# Patient Record
Sex: Male | Born: 1945 | Race: White | Hispanic: No | Smoking: Former smoker
Health system: Southern US, Community
[De-identification: ages and names within clinical notes are randomized; demographics above are authoritative.]

## PROBLEM LIST (undated history)

## (undated) DIAGNOSIS — I739 Peripheral vascular disease, unspecified: Secondary | ICD-10-CM

## (undated) DIAGNOSIS — Z86718 Personal history of other venous thrombosis and embolism: Secondary | ICD-10-CM

## (undated) DIAGNOSIS — Z8614 Personal history of Methicillin resistant Staphylococcus aureus infection: Secondary | ICD-10-CM

## (undated) HISTORY — PX: HIP SURGERY: SHX245

---

## 1998-04-20 ENCOUNTER — Inpatient Hospital Stay (HOSPITAL_COMMUNITY): Admission: EM | Admit: 1998-04-20 | Discharge: 1998-05-04 | Payer: Self-pay | Admitting: Emergency Medicine

## 1998-04-22 ENCOUNTER — Encounter: Payer: Self-pay | Admitting: Internal Medicine

## 1998-05-04 ENCOUNTER — Inpatient Hospital Stay
Admission: RE | Admit: 1998-05-04 | Discharge: 1998-06-25 | Payer: Self-pay | Admitting: Physical Medicine and Rehabilitation

## 1998-05-12 ENCOUNTER — Encounter: Payer: Self-pay | Admitting: Orthopedic Surgery

## 1998-06-15 ENCOUNTER — Encounter: Payer: Self-pay | Admitting: Physical Medicine and Rehabilitation

## 2005-09-20 ENCOUNTER — Encounter: Payer: Self-pay | Admitting: Emergency Medicine

## 2020-12-30 ENCOUNTER — Other Ambulatory Visit: Payer: Self-pay

## 2020-12-30 DIAGNOSIS — I739 Peripheral vascular disease, unspecified: Secondary | ICD-10-CM

## 2021-01-05 ENCOUNTER — Encounter (HOSPITAL_BASED_OUTPATIENT_CLINIC_OR_DEPARTMENT_OTHER): Payer: Medicare Other | Attending: Physician Assistant | Admitting: Physician Assistant

## 2021-01-07 ENCOUNTER — Ambulatory Visit (HOSPITAL_COMMUNITY)
Admission: RE | Admit: 2021-01-07 | Discharge: 2021-01-07 | Disposition: A | Discharge status: Home / Self Care | Payer: Medicare Other | Source: Ambulatory Visit | Attending: Vascular Surgery | Admitting: Vascular Surgery

## 2021-01-07 ENCOUNTER — Ambulatory Visit (INDEPENDENT_AMBULATORY_CARE_PROVIDER_SITE_OTHER): Payer: Medicare Other | Admitting: Vascular Surgery

## 2021-01-07 ENCOUNTER — Other Ambulatory Visit: Payer: Self-pay

## 2021-01-07 ENCOUNTER — Encounter: Payer: Self-pay | Admitting: Vascular Surgery

## 2021-01-07 VITALS — BP 180/84 | HR 67 | Temp 98.1°F | Resp 20

## 2021-01-07 DIAGNOSIS — I739 Peripheral vascular disease, unspecified: Secondary | ICD-10-CM | POA: Diagnosis not present

## 2021-01-07 NOTE — Progress Notes (Signed)
Patient ID: Francis Mckenzie, male   DOB: October 27, 1945, 75 y.o.   MRN: [id number]  Reason for Consult: New Patient (Initial Visit)   Referred by Eber Jones, NP  Subjective:     HPI:  Francis Mckenzie is a 75 y.o. male without previous history of vascular disease.  He is a lifelong smoker he currently vapes.  He states that several years ago he did have a wound on his medial left leg.  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  He states that he drinks 2-3 beers a day for his blood thinning.  He now has a couple month history of left lateral leg ulceration associated with swelling and skin changes.  There are 2 areas of ulceration.  He has not had any fevers or chills.  He did have Keflex treatment temporarily.  He is now getting local wound care with silver sulfadiazine and has been referred to wound care.  He has no previous vascular intervention.  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  He does not have stroke, TIA or amaurosis.  History reviewed. No pertinent past medical history.  History reviewed. No pertinent family history.  Past Surgical History:  Procedure Laterality Date   HIP SURGERY Right     Short Social History:  Social History   Tobacco Use   Smoking status: Former    Types: Cigarettes   Smokeless tobacco: Never  Substance Use Topics   Alcohol use: Yes    Not on File  Current Outpatient Medications  Medication Sig Dispense Refill   cephALEXin (KEFLEX) 500 MG capsule Take 500 mg by mouth every 12 (twelve) hours.     silver sulfADIAZINE (SILVADENE) 1 % cream Apply 1 application topically daily.     No current facility-administered medications for this visit.    Review of Systems  Constitutional:  Constitutional negative. HENT: HENT negative.  Eyes: Eyes negative.  Cardiovascular: Cardiovascular negative.  GI: Gastrointestinal negative.  Skin: Positive for wound.   Neurological: Neurological negative. Hematologic: Hematologic/lymphatic negative.  Psychiatric: Psychiatric negative.       Objective:  Objective   Vitals:   01/07/21 1543  BP: (!) 180/84  Pulse: 67  Resp: 20  Temp: 98.1 F (36.7 C)  SpO2: 98%   There is no height or weight on file to calculate BMI.  Physical Exam HENT:     Head: Normocephalic.     Nose:     Comments: Wearing a mask Eyes:     Pupils: Pupils are equal, round, and reactive to light.  Neck:     Vascular: No carotid bruit.  Cardiovascular:     Pulses:          Femoral pulses are 0 on the right side and 0 on the left side. Pulmonary:     Effort: Pulmonary effort is normal.  Abdominal:     General: Abdomen is flat.     Palpations: Abdomen is soft. There is no mass.  Neurological:     General: No focal deficit present.     Mental Status: He is alert.  Psychiatric:        Mood and Affect: Mood normal.        Behavior: Behavior normal.        Thought Content: Thought content normal.        Judgment: Judgment normal.     Data: ABI Findings:  +---------+------------------+-----+----------+--------+  Right    Rt Pressure (mmHg)IndexWaveform  Comment   +---------+------------------+-----+----------+--------+  Brachial 197                                        +---------+------------------+-----+----------+--------+  PTA      90                0.45 monophasic          +---------+------------------+-----+----------+--------+  DP       82                0.41 monophasic          +---------+------------------+-----+----------+--------+  Great Toe83                0.42                     +---------+------------------+-----+----------+--------+   +---------+------------------+-----+----------+-------+  Left     Lt Pressure (mmHg)IndexWaveform  Comment  +---------+------------------+-----+----------+-------+  Brachial 198                                        +---------+------------------+-----+----------+-------+  PTA      112               0.57 monophasic         +---------+------------------+-----+----------+-------+  DP       124               0.63 monophasic         +---------+------------------+-----+----------+-------+  Great Toe106               0.54                    +---------+------------------+-----+----------+-------+   +-------+-----------+-----------+------------+------------+  ABI/TBIToday's ABIToday's TBIPrevious ABIPrevious TBI  +-------+-----------+-----------+------------+------------+  Right  0.45       0.42                                 +-------+-----------+-----------+------------+------------+  Left   0.63       0.54                                 +-------+-----------+-----------+------------+------------+      Assessment/Plan:     75 year old male with left lower extremity wounds with moderately depressed ABIs that are monophasic.  I cannot reliably feel femoral pulses and I discussed with him either angiography from a left upper extremity approach versus CT angio.  We are going to pursue CT angio in ASAP fashion and get him back for further evaluation after this.  I discussed with him the nature of his ulceration which is likely mixed venous and arterial in nature but we will work-up the arterial phase prior to considering any venous treatment.  He demonstrates good understanding in the presence of his granddaughter today.     Maeola Harman MD [facility]

## 2021-01-11 ENCOUNTER — Other Ambulatory Visit: Payer: Self-pay

## 2021-01-11 DIAGNOSIS — I739 Peripheral vascular disease, unspecified: Secondary | ICD-10-CM

## 2021-01-17 ENCOUNTER — Other Ambulatory Visit: Payer: Self-pay

## 2021-01-17 ENCOUNTER — Ambulatory Visit (HOSPITAL_COMMUNITY)
Admission: RE | Admit: 2021-01-17 | Discharge: 2021-01-17 | Disposition: A | Discharge status: Home / Self Care | Payer: Medicare Other | Source: Ambulatory Visit | Attending: Vascular Surgery | Admitting: Vascular Surgery

## 2021-01-17 DIAGNOSIS — I739 Peripheral vascular disease, unspecified: Secondary | ICD-10-CM | POA: Diagnosis not present

## 2021-01-17 LAB — POCT I-STAT CREATININE: Creatinine, Ser: 0.9 mg/dL (ref 0.61–1.24)

## 2021-01-17 MED ORDER — IOHEXOL 350 MG/ML SOLN
100.0000 mL | Freq: Once | INTRAVENOUS | Status: AC | PRN
  Administered 2021-01-17: 100 mL via INTRAVENOUS

## 2021-01-19 ENCOUNTER — Other Ambulatory Visit: Payer: Self-pay

## 2021-01-19 ENCOUNTER — Ambulatory Visit (INDEPENDENT_AMBULATORY_CARE_PROVIDER_SITE_OTHER): Payer: Medicare Other | Admitting: Vascular Surgery

## 2021-01-19 ENCOUNTER — Encounter: Payer: Self-pay | Admitting: Vascular Surgery

## 2021-01-19 VITALS — BP 181/99 | HR 70 | Temp 97.9°F | Resp 20 | Ht 72.0 in | Wt 196.0 lb

## 2021-01-19 DIAGNOSIS — I739 Peripheral vascular disease, unspecified: Secondary | ICD-10-CM | POA: Diagnosis not present

## 2021-01-19 NOTE — Progress Notes (Signed)
Patient ID: Francis Mckenzie, male   DOB: 1945-05-29, 75 y.o.   MRN: [id number]  Reason for Consult: Follow-up   Referred by Eber Jones, NP  Subjective:     HPI:  Tyliek Timberman is a 75 y.o. male follows up for evaluation of left lower extremity wounds.  He has an IVC filter in place now drinks 2-3 beers daily for blood thinning medication is not on any blood thinners.  He does not take any antiplatelet agents.  He has not had any progression of his wounds on his left leg continues wound care with silver sulfadiazine.  He returns today for evaluation with CT scan  History reviewed. No pertinent past medical history. History reviewed. No pertinent family history. Past Surgical History:  Procedure Laterality Date   HIP SURGERY Right     Short Social History:  Social History   Tobacco Use   Smoking status: Former    Types: Cigarettes   Smokeless tobacco: Never  Substance Use Topics   Alcohol use: Yes    Not on File  Current Outpatient Medications  Medication Sig Dispense Refill   cephALEXin (KEFLEX) 500 MG capsule Take 500 mg by mouth every 12 (twelve) hours.     silver sulfADIAZINE (SILVADENE) 1 % cream Apply 1 application topically daily.     No current facility-administered medications for this visit.    Review of Systems  Constitutional:  Constitutional negative. HENT: HENT negative.  Eyes: Eyes negative.  Respiratory: Respiratory negative.  Cardiovascular: Cardiovascular negative.  GI: Gastrointestinal negative.  Musculoskeletal: Positive for leg pain.  Skin: Positive for wound.  Neurological: Neurological negative. Hematologic: Hematologic/lymphatic negative.  Psychiatric: Psychiatric negative.       Objective:  Objective   Vitals:   01/19/21 0952  BP: (!) 181/99  Pulse: 70  Resp: 20  Temp: 97.9 F (36.6 C)  SpO2: 97%  Weight: 196 lb (88.9 kg)  Height: 6' (1.829 m)   Body mass index is 26.58 kg/m.  Physical Exam HENT:     Head:  Normocephalic.     Nose:     Comments: Wearing a mask Eyes:     Pupils: Pupils are equal, round, and reactive to light.  Cardiovascular:     Pulses:          Femoral pulses are 0 on the right side and 0 on the left side. Pulmonary:     Effort: Pulmonary effort is normal.  Abdominal:     General: Abdomen is flat.     Palpations: Abdomen is soft.  Musculoskeletal:     Left lower leg: Edema present.  Skin:    Capillary Refill: Capillary refill takes less than 2 seconds.     Comments: Stable wound left lateral leg  Neurological:     General: No focal deficit present.     Mental Status: He is alert.    Data: CTA IMPRESSION: 1. 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 2. The right posterior tibial artery is very diminutive, and there is distal substitution of the posterior tibial artery branches by the peroneal artery. There are scattered foci of calcific atherosclerosis without significant stenosis and two vessel runoff of the anterior tibial and peroneal arteries to the ankle. 3. 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 4. Patent three vessel runoff to the left ankle  with standard branching anatomy. 5. Saphenous system varicosities about the calves. 6. Infrarenal IVC filter.     ABI Findings:  +---------+------------------+-----+----------+--------+  Right    Rt Pressure (mmHg)IndexWaveform  Comment   +---------+------------------+-----+----------+--------+  Brachial 197                                        +---------+------------------+-----+----------+--------+  PTA      90                0.45 monophasic          +---------+------------------+-----+----------+--------+  DP       82                0.41 monophasic           +---------+------------------+-----+----------+--------+  Great Toe83                0.42                     +---------+------------------+-----+----------+--------+   +---------+------------------+-----+----------+-------+  Left     Lt Pressure (mmHg)IndexWaveform  Comment  +---------+------------------+-----+----------+-------+  Brachial 198                                       +---------+------------------+-----+----------+-------+  PTA      112               0.57 monophasic         +---------+------------------+-----+----------+-------+  DP       124               0.63 monophasic         +---------+------------------+-----+----------+-------+  Great Toe106               0.54                    +---------+------------------+-----+----------+-------+   Assessment/Plan:     75 year old male with bilateral lower extremity decreased ABIs actually worse on the right but with wounds on the left leg.  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  Putting this altogether I have offered the patient left common femoral endarterectomy with retrograde left common iliac artery stenting and will likely need antegrade left popliteal artery stenting.  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  He will give baby aspirin.  We will get cardiac clearance and get him scheduled in the hybrid room in the near future.     Maeola Harman MD [facility]

## 2021-01-19 NOTE — H&P (View-Only) (Signed)
Patient ID: Francis Mckenzie, male   DOB: 1945-05-29, 75 y.o.   MRN: [id number]  Reason for Consult: Follow-up   Referred by Eber Jones, NP  Subjective:     HPI:  Francis Mckenzie is a 75 y.o. male follows up for evaluation of left lower extremity wounds.  He has an IVC filter in place now drinks 2-3 beers daily for blood thinning medication is not on any blood thinners.  He does not take any antiplatelet agents.  He has not had any progression of his wounds on his left leg continues wound care with silver sulfadiazine.  He returns today for evaluation with CT scan  History reviewed. No pertinent past medical history. History reviewed. No pertinent family history. Past Surgical History:  Procedure Laterality Date   HIP SURGERY Right     Short Social History:  Social History   Tobacco Use   Smoking status: Former    Types: Cigarettes   Smokeless tobacco: Never  Substance Use Topics   Alcohol use: Yes    Not on File  Current Outpatient Medications  Medication Sig Dispense Refill   cephALEXin (KEFLEX) 500 MG capsule Take 500 mg by mouth every 12 (twelve) hours.     silver sulfADIAZINE (SILVADENE) 1 % cream Apply 1 application topically daily.     No current facility-administered medications for this visit.    Review of Systems  Constitutional:  Constitutional negative. HENT: HENT negative.  Eyes: Eyes negative.  Respiratory: Respiratory negative.  Cardiovascular: Cardiovascular negative.  GI: Gastrointestinal negative.  Musculoskeletal: Positive for leg pain.  Skin: Positive for wound.  Neurological: Neurological negative. Hematologic: Hematologic/lymphatic negative.  Psychiatric: Psychiatric negative.       Objective:  Objective   Vitals:   01/19/21 0952  BP: (!) 181/99  Pulse: 70  Resp: 20  Temp: 97.9 F (36.6 C)  SpO2: 97%  Weight: 196 lb (88.9 kg)  Height: 6' (1.829 m)   Body mass index is 26.58 kg/m.  Physical Exam HENT:     Head:  Normocephalic.     Nose:     Comments: Wearing a mask Eyes:     Pupils: Pupils are equal, round, and reactive to light.  Cardiovascular:     Pulses:          Femoral pulses are 0 on the right side and 0 on the left side. Pulmonary:     Effort: Pulmonary effort is normal.  Abdominal:     General: Abdomen is flat.     Palpations: Abdomen is soft.  Musculoskeletal:     Left lower leg: Edema present.  Skin:    Capillary Refill: Capillary refill takes less than 2 seconds.     Comments: Stable wound left lateral leg  Neurological:     General: No focal deficit present.     Mental Status: He is alert.    Data: CTA IMPRESSION: 1. 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 2. The right posterior tibial artery is very diminutive, and there is distal substitution of the posterior tibial artery branches by the peroneal artery. There are scattered foci of calcific atherosclerosis without significant stenosis and two vessel runoff of the anterior tibial and peroneal arteries to the ankle. 3. 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 4. Patent three vessel runoff to the left ankle  with standard branching anatomy. 5. Saphenous system varicosities about the calves. 6. Infrarenal IVC filter.     ABI Findings:  +---------+------------------+-----+----------+--------+  Right    Rt Pressure (mmHg)IndexWaveform  Comment   +---------+------------------+-----+----------+--------+  Brachial 197                                        +---------+------------------+-----+----------+--------+  PTA      90                0.45 monophasic          +---------+------------------+-----+----------+--------+  DP       82                0.41 monophasic           +---------+------------------+-----+----------+--------+  Great Toe83                0.42                     +---------+------------------+-----+----------+--------+   +---------+------------------+-----+----------+-------+  Left     Lt Pressure (mmHg)IndexWaveform  Comment  +---------+------------------+-----+----------+-------+  Brachial 198                                       +---------+------------------+-----+----------+-------+  PTA      112               0.57 monophasic         +---------+------------------+-----+----------+-------+  DP       124               0.63 monophasic         +---------+------------------+-----+----------+-------+  Great Toe106               0.54                    +---------+------------------+-----+----------+-------+   Assessment/Plan:     75-year-old male with bilateral lower extremity decreased ABIs actually worse on the right but with wounds on the left leg.  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  Putting this altogether I have offered the patient left common femoral endarterectomy with retrograde left common iliac artery stenting and will likely need antegrade left popliteal artery stenting.  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  He will give baby aspirin.  We will get cardiac clearance and get him scheduled in the hybrid room in the near future.     Alvina Strother Christopher Amisadai Woodford MD [facility] [place]   

## 2021-01-21 ENCOUNTER — Encounter: Payer: Self-pay | Admitting: Interventional Cardiology

## 2021-01-21 ENCOUNTER — Encounter: Payer: Self-pay | Admitting: *Deleted

## 2021-01-21 ENCOUNTER — Other Ambulatory Visit: Payer: Self-pay

## 2021-01-21 ENCOUNTER — Ambulatory Visit (INDEPENDENT_AMBULATORY_CARE_PROVIDER_SITE_OTHER): Payer: Medicare Other | Admitting: Interventional Cardiology

## 2021-01-21 VITALS — BP 141/78 | HR 78 | Ht 72.0 in | Wt 196.6 lb

## 2021-01-21 DIAGNOSIS — E785 Hyperlipidemia, unspecified: Secondary | ICD-10-CM

## 2021-01-21 DIAGNOSIS — I700 Atherosclerosis of aorta: Secondary | ICD-10-CM

## 2021-01-21 DIAGNOSIS — I10 Essential (primary) hypertension: Secondary | ICD-10-CM | POA: Diagnosis not present

## 2021-01-21 DIAGNOSIS — I739 Peripheral vascular disease, unspecified: Secondary | ICD-10-CM

## 2021-01-21 DIAGNOSIS — Z01818 Encounter for other preprocedural examination: Secondary | ICD-10-CM | POA: Diagnosis not present

## 2021-01-21 DIAGNOSIS — Z01810 Encounter for preprocedural cardiovascular examination: Secondary | ICD-10-CM

## 2021-01-21 DIAGNOSIS — I2510 Atherosclerotic heart disease of native coronary artery without angina pectoris: Secondary | ICD-10-CM

## 2021-01-21 NOTE — Patient Instructions (Signed)
Medication Instructions:  Your physician recommends that you continue on your current medications as directed. Please refer to the Current Medication list given to you today.  *If you need a refill on your cardiac medications before your next appointment, please call your pharmacy*   Lab Work: None If you have labs (blood work) drawn today and your tests are completely normal, you will receive your results only by: MyChart Message (if you have MyChart) OR A paper copy in the mail If you have any lab test that is abnormal or we need to change your treatment, we will call you to review the results.   Testing/Procedures: Your physician has requested that you have a lexiscan myoview. For further information please visit [email or web address]. Please follow instruction sheet, as given.   Follow-Up: At [facility], you and your health needs are our priority.  As part of our continuing mission to provide you with exceptional heart care, we have created designated Provider Care Teams.  These Care Teams include your primary Cardiologist (physician) and Advanced Practice Providers (APPs -  Physician Assistants and Nurse Practitioners) who all work together to provide you with the care you need, when you need it.  We recommend signing up for the patient portal called "MyChart".  Sign up information is provided on this After Visit Summary.  MyChart is used to connect with patients for Virtual Visits (Telemedicine).  Patients are able to view lab/test results, encounter notes, upcoming appointments, etc.  Non-urgent messages can be sent to your provider as well.   To learn more about what you can do with MyChart, go to [email or web address].    Your next appointment:   As needed  The format for your next appointment:   In Person  Provider:   You may see Henry Smith, MD or one of the following Advanced Practice Providers on your designated Care Team:   Laura Ingold, NP   Other Instructions    

## 2021-01-21 NOTE — Progress Notes (Signed)
Cardiology Office Note:    Date:  01/21/2021   ID:  Francis Mckenzie, DOB 10-29-1945, MRN [id number]  PCP:  Eber Jones, NP  Cardiologist:  None   Referring MD: Eber Jones, NP   Chief Complaint  Patient presents with   Coronary Artery Disease    Preoperative clearance for peripheral arterial disease surgery     History of Present Illness:    Francis Mckenzie is a 75 y.o. male with a hx of bilateral lower extremity claudication with poorly healing ulcer to undergo surgical revascularization and percutaneous revascularization of lower extremities.  Referred for clearance by Dr. Lemar Mckenzie.  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  He has no mobility in that joint.  He was hospitalized for greater than 5 months and apparently developed DVT requiring vena caval filter placement.  He never had PE.  He has a family history of vascular disease.  His brother and sister have both had MIs.  The brother died.  The patient smokes cigarettes and or vapes for greater than 60 years.  Recently he has noted a nonhealing ulcer on his left calf.  Is been present for greater than 2 months.  Lower extremity Doppler studies were performed by his physician and #.  Significant reduction in ABI was noted.  Subsequent CT angiography demonstrated high-grade obstructive disease   History reviewed. No pertinent past medical history.  Past Surgical History:  Procedure Laterality Date   HIP SURGERY Right     Current Medications: Current Meds  Medication Sig   cephALEXin (KEFLEX) 500 MG capsule Take 500 mg by mouth every 12 (twelve) hours.   silver sulfADIAZINE (SILVADENE) 1 % cream Apply 1 application topically daily.     Allergies:   Aspirin   Social History   Socioeconomic History   Marital status: Widowed    Spouse name: Not on file   Number of children: Not on file    Years of education: Not on file   Highest education level: Not on file  Occupational History   Not on file  Tobacco Use   Smoking status: Former    Types: Cigarettes   Smokeless tobacco: Never  Vaping Use   Vaping Use: Every day  Substance and Sexual Activity   Alcohol use: Yes   Drug use: Not on file   Sexual activity: Not on file  Other Topics Concern   Not on file  Social History Narrative   Not on file   Social Determinants of Health   Financial Resource Strain: Not on file  Food Insecurity: Not on file  Transportation Needs: Not on file  Physical Activity: Not on file  Stress: Not on file  Social Connections: Not on file     Family History: The patient's family history includes Heart attack in his sister.  ROS:   Please see the history of present illness.    Active.  Does chores around the house.  Denies shortness of breath, orthopnea, PND.  No leg pain.  Lower extremity lateral left calf ulcer present and nonhealing for about 2 months.  Denies chills and fever.  All other systems reviewed and are negative.  EKGs/Labs/Other Studies Reviewed:    The following studies were reviewed today:  Lower extremity vascular ultrasound 01/07/2021: Summary:  Right: Resting right ankle-brachial index indicates severe right lower  extremity arterial disease. The right  toe-brachial index is abnormal.   Left: Resting left ankle-brachial index indicates moderate left lower  extremity arterial disease. The left toe-brachial index is abnormal.     CT angio of abdominal aorta and bifemoral runoff 01/17/2021: IMPRESSION: 1. 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 2. The right posterior tibial artery is very diminutive, and there is distal substitution of the posterior tibial artery branches by the peroneal  artery. There are scattered foci of calcific atherosclerosis without significant stenosis and two vessel runoff of the anterior tibial and peroneal arteries to the ankle. 3. 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 4. Patent three vessel runoff to the left ankle with standard branching anatomy. 5. Saphenous system varicosities about the calves. 6. Infrarenal IVC filter.   Aortic Atherosclerosis (ICD10-I70.0).     EKG:  EKG normal sinus rhythm, normal PR, normal tracing.  No evidence of prior MI.  Recent Labs: 01/17/2021: Creatinine, Ser 0.90  Recent Lipid Panel No results found for: CHOL, TRIG, HDL, CHOLHDL, VLDL, LDLCALC, LDLDIRECT  Physical Exam:    VS:  BP (!) 141/78   Pulse 78   Ht 6' (1.829 m)   Wt 196 lb 9.6 oz (89.2 kg)   SpO2 96%   BMI 26.66 kg/m     Wt Readings from Last 3 Encounters:  01/21/21 196 lb 9.6 oz (89.2 kg)  01/19/21 196 lb (88.9 kg)     GEN: Compatible with age.  Sitting somewhat askew in the chair due to immobility of his right hip.. No acute distress HEENT: Normal NECK: No JVD. LYMPHATICS: No lymphadenopathy CARDIAC: No murmur. RRR no gallop, or edema. VASCULAR: Decreased Pulses bilaterally.  Lateral left calf ulcer, 2 spots.. No bruits. RESPIRATORY:  Clear to auscultation without rales, wheezing or rhonchi  ABDOMEN: Soft, non-tender, non-distended, No pulsatile mass, MUSCULOSKELETAL: No deformity  SKIN: Warm and dry NEUROLOGIC:  Alert and oriented x 3 PSYCHIATRIC:  Normal affect   ASSESSMENT:    1. Preoperative clearance   2. Intermittent claudication (HCC)   3. Hyperlipidemia LDL goal <70   4. Primary hypertension    PLAN:    In order of problems listed above:  Preoperative clearance is pending successful low risk performance on myocardial perfusion imaging with pharmacologic stress. He is in need of vascular surgery to improve lower extremity flow and allow healing of the ulcer  on his left calf. Needs high intensity statin therapy.  I do not have a lipid panel available. Blood pressure is a bit higher than we would like.  A medication such as amlodipine may be helpful.  Pharmacologic myocardial perfusion study and clearance for surgery if the study is low risk.  We will expedite the study to allow surgery to be done as soon as possible.   Medication Adjustments/Labs and Tests Ordered: Current medicines are reviewed at length with the patient today.  Concerns regarding medicines are outlined above.  Orders Placed This Encounter  Procedures   EKG 12-Lead    No orders of the defined types were placed in this encounter.   There are no Patient Instructions on file for this visit.   Signed, Lesleigh Noe, MD  01/21/2021 4:13 PM    [facility]

## 2021-01-26 ENCOUNTER — Telehealth (HOSPITAL_COMMUNITY): Payer: Self-pay

## 2021-01-26 NOTE — Telephone Encounter (Signed)
Spoke with the patient's daughter. She stated that he would be here for his test. Asked to call back with any questions. S.Branna Cortina EMTP

## 2021-01-27 ENCOUNTER — Ambulatory Visit (HOSPITAL_COMMUNITY): Payer: Medicare Other | Attending: Cardiovascular Disease

## 2021-01-27 ENCOUNTER — Other Ambulatory Visit: Payer: Self-pay

## 2021-01-27 DIAGNOSIS — Z01810 Encounter for preprocedural cardiovascular examination: Secondary | ICD-10-CM | POA: Diagnosis not present

## 2021-01-27 DIAGNOSIS — I739 Peripheral vascular disease, unspecified: Secondary | ICD-10-CM | POA: Insufficient documentation

## 2021-01-27 DIAGNOSIS — I10 Essential (primary) hypertension: Secondary | ICD-10-CM | POA: Insufficient documentation

## 2021-01-27 DIAGNOSIS — E785 Hyperlipidemia, unspecified: Secondary | ICD-10-CM | POA: Insufficient documentation

## 2021-01-27 DIAGNOSIS — Z01818 Encounter for other preprocedural examination: Secondary | ICD-10-CM | POA: Insufficient documentation

## 2021-01-27 DIAGNOSIS — I700 Atherosclerosis of aorta: Secondary | ICD-10-CM | POA: Insufficient documentation

## 2021-01-27 DIAGNOSIS — I2510 Atherosclerotic heart disease of native coronary artery without angina pectoris: Secondary | ICD-10-CM | POA: Insufficient documentation

## 2021-01-27 LAB — MYOCARDIAL PERFUSION IMAGING
Base ST Depression (mm): 0 mm
LV dias vol: 86 mL (ref 62–150)
LV sys vol: 46 mL
Nuc Stress EF: 46 %
Peak HR: 84 {beats}/min
Rest HR: 61 {beats}/min
Rest Nuclear Isotope Dose: 10.1 mCi
SDS: 3
SRS: 0
SSS: 3
ST Depression (mm): 0 mm
Stress Nuclear Isotope Dose: 31.4 mCi
TID: 1.07

## 2021-01-27 MED ORDER — TECHNETIUM TC 99M TETROFOSMIN IV KIT
31.4000 | PACK | Freq: Once | INTRAVENOUS | Status: AC | PRN
  Administered 2021-01-27: 31.4 via INTRAVENOUS
  Filled 2021-01-27: qty 32

## 2021-01-27 MED ORDER — REGADENOSON 0.4 MG/5ML IV SOLN
0.4000 mg | Freq: Once | INTRAVENOUS | Status: AC
  Administered 2021-01-27: 0.4 mg via INTRAVENOUS

## 2021-01-27 MED ORDER — TECHNETIUM TC 99M TETROFOSMIN IV KIT
10.1000 | PACK | Freq: Once | INTRAVENOUS | Status: AC | PRN
  Administered 2021-01-27: 10.1 via INTRAVENOUS
  Filled 2021-01-27: qty 11

## 2021-01-31 NOTE — Pre-Procedure Instructions (Signed)
Surgical Instructions    Your procedure is scheduled on Friday, September 16th.  Report to [facility] Main Entrance "A" at 7:00 A.M., then check in with the Admitting office.  Call this number if you have problems the morning of surgery:  [phone]   If you have any questions prior to your surgery date call [phone]: Open Monday-Friday 8am-4pm    Remember:  Do not eat or drink after midnight the night before your surgery   Take these medicines as needed the morning of surgery with A SIP OF WATER  acetaminophen (TYLENOL)   As of today, STOP taking any Aspirin (unless otherwise instructed by your surgeon) Aleve, Naproxen, Ibuprofen, Motrin, Advil, Goody's, BC's, all herbal medications, fish oil, and all vitamins.                     Do NOT Smoke (Tobacco/Vaping) or drink Alcohol 24 hours prior to your procedure.  If you use a CPAP at night, you may bring all equipment for your overnight stay.   Contacts, glasses, piercing's, hearing aid's, dentures or partials may not be worn into surgery, please bring cases for these belongings.    For patients admitted to the hospital, discharge time will be determined by your treatment team.   Patients discharged the day of surgery will not be allowed to drive home, and someone needs to stay with them for 24 hours.  ONLY 1 SUPPORT PERSON MAY BE PRESENT WHILE YOU ARE IN SURGERY. IF YOU ARE TO BE ADMITTED ONCE YOU ARE IN YOUR ROOM YOU WILL BE ALLOWED TWO (2) VISITORS.  Minor children may have two parents present. Special consideration for safety and communication needs will be reviewed on a case by case basis.   Special instructions:   [place]- Preparing For Surgery  Before surgery, you can play an important role. Because skin is not sterile, your skin needs to be as free of germs as possible. You can reduce the number of germs on your skin by washing with CHG (chlorahexidine gluconate) Soap before surgery.  CHG is an antiseptic cleaner  which kills germs and bonds with the skin to continue killing germs even after washing.    Oral Hygiene is also important to reduce your risk of infection.  Remember - BRUSH YOUR TEETH THE MORNING OF SURGERY WITH YOUR REGULAR TOOTHPASTE  Please do not use if you have an allergy to CHG or antibacterial soaps. If your skin becomes reddened/irritated stop using the CHG.  Do not shave (including legs and underarms) for at least 48 hours prior to first CHG shower. It is OK to shave your face.  Please follow these instructions carefully.   Shower the NIGHT BEFORE SURGERY and the MORNING OF SURGERY  If you chose to wash your hair, wash your hair first as usual with your normal shampoo.  After you shampoo, rinse your hair and body thoroughly to remove the shampoo.  Use CHG Soap as you would any other liquid soap. You can apply CHG directly to the skin and wash gently with a scrungie or a clean washcloth.   Apply the CHG Soap to your body ONLY FROM THE NECK DOWN.  Do not use on open wounds or open sores. Avoid contact with your eyes, ears, mouth and genitals (private parts). Wash Face and genitals (private parts)  with your normal soap.   Wash thoroughly, paying special attention to the area where your surgery will be performed.  Thoroughly rinse your body with  warm water from the neck down.  DO NOT shower/wash with your normal soap after using and rinsing off the CHG Soap.  Pat yourself dry with a CLEAN TOWEL.  Wear CLEAN PAJAMAS to bed the night before surgery  Place CLEAN SHEETS on your bed the night before your surgery  DO NOT SLEEP WITH PETS.   Day of Surgery: Shower with CHG soap. Do not wear jewelry Do not wear lotions, powders, colognes, or deodorant. Do not shave 48 hours prior to surgery.  Men may shave face and neck. Do not bring valuables to the hospital. [facility] is not responsible for any belongings or valuables. Wear Clean/Comfortable clothing the morning of  surgery Remember to brush your teeth WITH YOUR REGULAR TOOTHPASTE.   Please read over the following fact sheets that you were given.

## 2021-02-01 ENCOUNTER — Other Ambulatory Visit: Payer: Self-pay

## 2021-02-01 ENCOUNTER — Encounter (HOSPITAL_COMMUNITY)
Admission: RE | Admit: 2021-02-01 | Discharge: 2021-02-01 | Disposition: A | Discharge status: Home / Self Care | Payer: Medicare Other | Source: Ambulatory Visit | Attending: Vascular Surgery | Admitting: Vascular Surgery

## 2021-02-01 ENCOUNTER — Encounter (HOSPITAL_COMMUNITY): Payer: Self-pay

## 2021-02-01 DIAGNOSIS — Z20822 Contact with and (suspected) exposure to covid-19: Secondary | ICD-10-CM | POA: Insufficient documentation

## 2021-02-01 DIAGNOSIS — Z01812 Encounter for preprocedural laboratory examination: Secondary | ICD-10-CM | POA: Insufficient documentation

## 2021-02-01 HISTORY — DX: Peripheral vascular disease, unspecified: I73.9

## 2021-02-01 LAB — COMPREHENSIVE METABOLIC PANEL
ALT: 16 U/L (ref 0–44)
AST: 16 U/L (ref 15–41)
Albumin: 3.8 g/dL (ref 3.5–5.0)
Alkaline Phosphatase: 89 U/L (ref 38–126)
Anion gap: 10 (ref 5–15)
BUN: 9 mg/dL (ref 8–23)
CO2: 24 mmol/L (ref 22–32)
Calcium: 8.9 mg/dL (ref 8.9–10.3)
Chloride: 100 mmol/L (ref 98–111)
Creatinine, Ser: 0.88 mg/dL (ref 0.61–1.24)
GFR, Estimated: >60 mL/min (ref >60)
Glucose, Bld: 94 mg/dL (ref 70–99)
Potassium: 3.8 mmol/L (ref 3.5–5.1)
Sodium: 134 mmol/L — ABNORMAL LOW (ref 135–145)
Total Bilirubin: 1.7 mg/dL — ABNORMAL HIGH (ref 0.3–1.2)
Total Protein: 7.1 g/dL (ref 6.5–8.1)

## 2021-02-01 LAB — URINALYSIS, ROUTINE W REFLEX MICROSCOPIC
Bilirubin Urine: NEGATIVE
Glucose, UA: NEGATIVE mg/dL
Hgb urine dipstick: NEGATIVE
Ketones, ur: NEGATIVE mg/dL
Leukocytes,Ua: NEGATIVE
Nitrite: NEGATIVE
Protein, ur: NEGATIVE mg/dL
Specific Gravity, Urine: 1.009 (ref 1.005–1.030)
pH: 6 (ref 5.0–8.0)

## 2021-02-01 LAB — TYPE AND SCREEN
ABO/RH(D): A POS
Antibody Screen: NEGATIVE

## 2021-02-01 LAB — CBC
HCT: 49.2 % (ref 39.0–52.0)
Hemoglobin: 16.1 g/dL (ref 13.0–17.0)
MCH: 30.4 pg (ref 26.0–34.0)
MCHC: 32.7 g/dL (ref 30.0–36.0)
MCV: 92.8 fL (ref 80.0–100.0)
Platelets: 229 10*3/uL (ref 150–400)
RBC: 5.3 MIL/uL (ref 4.22–5.81)
RDW: 12.8 % (ref 11.5–15.5)
WBC: 6.9 10*3/uL (ref 4.0–10.5)
nRBC: 0 % (ref 0.0–0.2)

## 2021-02-01 LAB — SURGICAL PCR SCREEN
MRSA, PCR: POSITIVE — AB
Staphylococcus aureus: POSITIVE — AB

## 2021-02-01 LAB — PROTIME-INR
INR: 0.9 (ref 0.8–1.2)
Prothrombin Time: 12.3 seconds (ref 11.4–15.2)

## 2021-02-01 LAB — SARS CORONAVIRUS 2 (TAT 6-24 HRS): SARS Coronavirus 2: NEGATIVE

## 2021-02-01 LAB — APTT: aPTT: 28 seconds (ref 24–36)

## 2021-02-01 NOTE — Progress Notes (Signed)
PCP - Terrilyn Saver, NP Cardiologist - Dr. Verdis Prime  Chest x-ray - n/a EKG - 01/21/21 Stress Test - 01/27/21 ECHO - denies Cardiac Cath - denies  Sleep Study - denies CPAP - denies  Blood Thinner Instructions: n/a Aspirin Instructions: n/a  COVID TEST- 02/01/21; done in PAT   Anesthesia review: Yes, cardiac clearance 01/27/21  Patient denies shortness of breath, fever, cough and chest pain at PAT appointment   All instructions explained to the patient, with a verbal understanding of the material. Patient agrees to go over the instructions while at home for a better understanding. Patient also instructed to self quarantine after being tested for COVID-19. The opportunity to ask questions was provided.

## 2021-02-01 NOTE — Progress Notes (Signed)
Detailed VM left with Kia Breedlove at [facility] regarding pt being positive for MRSA/MSSA. Left callback number if needed.  Viviano Simas, RN

## 2021-02-02 NOTE — Anesthesia Preprocedure Evaluation (Addendum)
Anesthesia Evaluation  Patient identified by MRN, date of birth, ID band Patient awake    Reviewed: Allergy & Precautions, NPO status , Patient's Chart, lab work & pertinent test results  Airway Mallampati: II  TM Distance: >3 FB Neck ROM: Full    Dental  (+) Dental Advisory Given, Edentulous Lower, Edentulous Upper   Pulmonary former smoker,    Pulmonary exam normal breath sounds clear to auscultation       Cardiovascular + Peripheral Vascular Disease (PAD)  Normal cardiovascular exam Rhythm:Regular Rate:Normal     Neuro/Psych negative neurological ROS  negative psych ROS   GI/Hepatic negative GI ROS, Neg liver ROS,   Endo/Other  negative endocrine ROS  Renal/GU negative Renal ROS     Musculoskeletal negative musculoskeletal ROS (+)   Abdominal   Peds  Hematology negative hematology ROS (+)   Anesthesia Other Findings Day of surgery medications reviewed with the patient.  Reproductive/Obstetrics                           Anesthesia Physical Anesthesia Plan  ASA: 3  Anesthesia Plan: General   Post-op Pain Management:    Induction: Intravenous  PONV Risk Score and Plan: 2 and Dexamethasone and Ondansetron  Airway Management Planned: Oral ETT  Additional Equipment: Arterial line  Intra-op Plan:   Post-operative Plan: Extubation in OR  Informed Consent: I have reviewed the patients History and Physical, chart, labs and discussed the procedure including the risks, benefits and alternatives for the proposed anesthesia with the patient or authorized representative who has indicated his/her understanding and acceptance.     Dental advisory given  Plan Discussed with: CRNA  Anesthesia Plan Comments: (PAT note by Antionette Poles, PA-C: Patient had preop evaluation with cardiologist Dr. Verdis Prime on 01/21/2021.  Nuclear stress test was ordered for risk stratification.  Test done  01/27/2021 was low risk and Dr. Katrinka Blazing cleared patient to proceed with surgery.  History of DVT during prolonged hospitalization following right hip replacement complicated by infection.  IVC in place.  Poorly controlled hypertension.  He is not on any antihypertensive agents. BP Readings from Last 3 Encounters: 02/01/21 (!) 185/89 01/21/21 (!) 141/78 01/19/21 (!) 181/99    Preop labs reviewed, unremarkable.  EKG 01/21/2021: NSR.  Rate 78.  Nuclear stress 01/27/2021: . The study is normal. The study is low risk. . No ST deviation was noted. . LV perfusion is normal. There is no evidence of ischemia. There is no evidence of infarction. . Left ventricular function is normal. Nuclear stress EF: 46 %. The left ventricular ejection fraction is mildly decreased (45-54%). End diastolic cavity size is normal.  1. Reduced counts in the inferior segments with normal wall motion consistent with diaphragm attenuation. No evidence of ischemia or infarction. 2. LV function at lower limits of normal for this modality, 46%. Visually appears normal. 3. This is a low risk study.  )       Anesthesia Quick Evaluation

## 2021-02-02 NOTE — Progress Notes (Signed)
Anesthesia Chart Review:  Patient had preop evaluation with cardiologist Dr. Verdis Prime on 01/21/2021.  Nuclear stress test was ordered for risk stratification.  Test done 01/27/2021 was low risk and Dr. Katrinka Blazing cleared patient to proceed with surgery.  History of DVT during prolonged hospitalization following right hip replacement complicated by infection.  IVC in place.  Poorly controlled hypertension.  He is not on any antihypertensive agents. BP Readings from Last 3 Encounters:  02/01/21 (!) 185/89  01/21/21 (!) 141/78  01/19/21 (!) 181/99     Preop labs reviewed, unremarkable.  EKG 01/21/2021: NSR.  Rate 78.  Nuclear stress 01/27/2021:   The study is normal. The study is low risk.   No ST deviation was noted.   LV perfusion is normal. There is no evidence of ischemia. There is no evidence of infarction.   Left ventricular function is normal. Nuclear stress EF: 46 %. The left ventricular ejection fraction is mildly decreased (45-54%). End diastolic cavity size is normal.   Reduced counts in the inferior segments with normal wall motion consistent with diaphragm attenuation.  No evidence of ischemia or infarction. LV function at lower limits of normal for this modality, 46%.  Visually appears normal. This is a low risk study.    Zannie Cove [facility] Short Stay Center/Anesthesiology Phone [phone] 02/02/2021 12:57 PM

## 2021-02-04 ENCOUNTER — Other Ambulatory Visit: Payer: Self-pay

## 2021-02-04 ENCOUNTER — Inpatient Hospital Stay (HOSPITAL_COMMUNITY): Payer: Medicare Other | Admitting: Physician Assistant

## 2021-02-04 ENCOUNTER — Inpatient Hospital Stay (HOSPITAL_COMMUNITY): Payer: Medicare Other

## 2021-02-04 ENCOUNTER — Encounter (HOSPITAL_COMMUNITY): Payer: Self-pay | Admitting: Vascular Surgery

## 2021-02-04 ENCOUNTER — Inpatient Hospital Stay (HOSPITAL_COMMUNITY)
Admission: RE | Admit: 2021-02-04 | Discharge: 2021-02-06 | DRG: 253 | Disposition: A | Discharge status: Home Health | Payer: Medicare Other | Attending: Vascular Surgery | Admitting: Vascular Surgery

## 2021-02-04 ENCOUNTER — Encounter (HOSPITAL_COMMUNITY)
Admission: RE | Disposition: A | Discharge status: Home / Self Care | Payer: Self-pay | Source: Ambulatory Visit | Attending: Vascular Surgery

## 2021-02-04 ENCOUNTER — Inpatient Hospital Stay (HOSPITAL_COMMUNITY): Payer: Medicare Other | Admitting: Anesthesiology

## 2021-02-04 DIAGNOSIS — T84194A Other mechanical complication of internal fixation device of right femur, initial encounter: Secondary | ICD-10-CM | POA: Diagnosis present

## 2021-02-04 DIAGNOSIS — Z8249 Family history of ischemic heart disease and other diseases of the circulatory system: Secondary | ICD-10-CM | POA: Diagnosis not present

## 2021-02-04 DIAGNOSIS — I70248 Atherosclerosis of native arteries of left leg with ulceration of other part of lower left leg: Secondary | ICD-10-CM | POA: Diagnosis present

## 2021-02-04 DIAGNOSIS — I739 Peripheral vascular disease, unspecified: Secondary | ICD-10-CM

## 2021-02-04 DIAGNOSIS — I70245 Atherosclerosis of native arteries of left leg with ulceration of other part of foot: Secondary | ICD-10-CM

## 2021-02-04 DIAGNOSIS — I472 Ventricular tachycardia: Secondary | ICD-10-CM | POA: Diagnosis not present

## 2021-02-04 DIAGNOSIS — Z87891 Personal history of nicotine dependence: Secondary | ICD-10-CM | POA: Diagnosis not present

## 2021-02-04 DIAGNOSIS — Z20822 Contact with and (suspected) exposure to covid-19: Secondary | ICD-10-CM | POA: Diagnosis present

## 2021-02-04 DIAGNOSIS — Z95828 Presence of other vascular implants and grafts: Secondary | ICD-10-CM

## 2021-02-04 DIAGNOSIS — Z419 Encounter for procedure for purposes other than remedying health state, unspecified: Secondary | ICD-10-CM

## 2021-02-04 DIAGNOSIS — I10 Essential (primary) hypertension: Secondary | ICD-10-CM | POA: Diagnosis present

## 2021-02-04 DIAGNOSIS — Y848 Other medical procedures as the cause of abnormal reaction of the patient, or of later complication, without mention of misadventure at the time of the procedure: Secondary | ICD-10-CM | POA: Diagnosis present

## 2021-02-04 DIAGNOSIS — Z86718 Personal history of other venous thrombosis and embolism: Secondary | ICD-10-CM | POA: Diagnosis not present

## 2021-02-04 DIAGNOSIS — Z9889 Other specified postprocedural states: Secondary | ICD-10-CM

## 2021-02-04 HISTORY — PX: INSERTION OF ILIAC STENT: SHX6256

## 2021-02-04 HISTORY — PX: ENDARTERECTOMY FEMORAL: SHX5804

## 2021-02-04 HISTORY — PX: LOWER EXTREMITY ANGIOGRAM: SHX5508

## 2021-02-04 HISTORY — DX: Personal history of other venous thrombosis and embolism: Z86.718

## 2021-02-04 HISTORY — DX: Personal history of Methicillin resistant Staphylococcus aureus infection: Z86.14

## 2021-02-04 HISTORY — PX: INTRAVASCULAR STENT INCLUDING PTA AND IMAGING: SHX6671

## 2021-02-04 LAB — CBC
HCT: 48 % (ref 39.0–52.0)
Hemoglobin: 15.8 g/dL (ref 13.0–17.0)
MCH: 30.7 pg (ref 26.0–34.0)
MCHC: 32.9 g/dL (ref 30.0–36.0)
MCV: 93.2 fL (ref 80.0–100.0)
Platelets: 187 10*3/uL (ref 150–400)
RBC: 5.15 MIL/uL (ref 4.22–5.81)
RDW: 12.9 % (ref 11.5–15.5)
WBC: 10.2 10*3/uL (ref 4.0–10.5)
nRBC: 0 % (ref 0.0–0.2)

## 2021-02-04 LAB — ABO/RH: ABO/RH(D): A POS

## 2021-02-04 LAB — CREATININE, SERUM
Creatinine, Ser: 0.94 mg/dL (ref 0.61–1.24)
GFR, Estimated: >60 mL/min (ref >60)

## 2021-02-04 SURGERY — ENDARTERECTOMY, FEMORAL
Anesthesia: General | Laterality: Left

## 2021-02-04 MED ORDER — MAGNESIUM SULFATE 2 GM/50ML IV SOLN: 2.0000 g | Freq: Every day | INTRAVENOUS | Status: DC | PRN

## 2021-02-04 MED ORDER — PANTOPRAZOLE SODIUM 40 MG PO TBEC
40.0000 mg | DELAYED_RELEASE_TABLET | Freq: Every day | ORAL | Status: DC
  Administered 2021-02-05 – 2021-02-06 (×2): 40 mg via ORAL
  Filled 2021-02-04 (×2): qty 1

## 2021-02-04 MED ORDER — PROTAMINE SULFATE 10 MG/ML IV SOLN
INTRAVENOUS | Status: AC
  Filled 2021-02-04: qty 5

## 2021-02-04 MED ORDER — SENNOSIDES-DOCUSATE SODIUM 8.6-50 MG PO TABS: 1.0000 | ORAL_TABLET | Freq: Every evening | ORAL | Status: DC | PRN

## 2021-02-04 MED ORDER — DEXAMETHASONE SODIUM PHOSPHATE 10 MG/ML IJ SOLN
INTRAMUSCULAR | Status: AC
  Filled 2021-02-04: qty 1

## 2021-02-04 MED ORDER — DEXMEDETOMIDINE (PRECEDEX) IN NS 20 MCG/5ML (4 MCG/ML) IV SYRINGE
PREFILLED_SYRINGE | INTRAVENOUS | Status: DC | PRN
  Administered 2021-02-04: 8 ug via INTRAVENOUS

## 2021-02-04 MED ORDER — PHENYLEPHRINE 40 MCG/ML (10ML) SYRINGE FOR IV PUSH (FOR BLOOD PRESSURE SUPPORT)
PREFILLED_SYRINGE | INTRAVENOUS | Status: DC | PRN
  Administered 2021-02-04: 120 ug via INTRAVENOUS

## 2021-02-04 MED ORDER — POVIDONE-IODINE 10 % EX SOLN
CUTANEOUS | Status: DC | PRN
  Administered 2021-02-04: 1 via TOPICAL

## 2021-02-04 MED ORDER — HYDROMORPHONE HCL 1 MG/ML IJ SOLN: 0.5000 mg | INTRAMUSCULAR | Status: DC | PRN

## 2021-02-04 MED ORDER — HEPARIN SODIUM (PORCINE) 1000 UNIT/ML IJ SOLN
INTRAMUSCULAR | Status: DC | PRN
  Administered 2021-02-04: 5000 [IU] via INTRAVENOUS
  Administered 2021-02-04: 8000 [IU] via INTRAVENOUS

## 2021-02-04 MED ORDER — CLOPIDOGREL BISULFATE 75 MG PO TABS
75.0000 mg | ORAL_TABLET | Freq: Every day | ORAL | Status: DC
  Administered 2021-02-05 – 2021-02-06 (×2): 75 mg via ORAL
  Filled 2021-02-04 (×2): qty 1

## 2021-02-04 MED ORDER — HYDRALAZINE HCL 20 MG/ML IJ SOLN
INTRAMUSCULAR | Status: AC
  Administered 2021-02-04: 5 mg via INTRAVENOUS
  Filled 2021-02-04: qty 1

## 2021-02-04 MED ORDER — ENOXAPARIN SODIUM 30 MG/0.3ML IJ SOSY
30.0000 mg | PREFILLED_SYRINGE | INTRAMUSCULAR | Status: DC
  Administered 2021-02-05: 30 mg via SUBCUTANEOUS
  Filled 2021-02-04 (×2): qty 0.3

## 2021-02-04 MED ORDER — CEFAZOLIN SODIUM-DEXTROSE 2-4 GM/100ML-% IV SOLN
2.0000 g | Freq: Three times a day (TID) | INTRAVENOUS | Status: AC
  Administered 2021-02-04 – 2021-02-05 (×2): 2 g via INTRAVENOUS
  Filled 2021-02-04 (×2): qty 100

## 2021-02-04 MED ORDER — LIDOCAINE 2% (20 MG/ML) 5 ML SYRINGE
INTRAMUSCULAR | Status: DC | PRN
  Administered 2021-02-04: 80 mg via INTRAVENOUS

## 2021-02-04 MED ORDER — 0.9 % SODIUM CHLORIDE (POUR BTL) OPTIME
TOPICAL | Status: DC | PRN
  Administered 2021-02-04: 1000 mL

## 2021-02-04 MED ORDER — HEPARIN SODIUM (PORCINE) 1000 UNIT/ML IJ SOLN
INTRAMUSCULAR | Status: AC
  Filled 2021-02-04: qty 2

## 2021-02-04 MED ORDER — BISACODYL 5 MG PO TBEC: 5.0000 mg | DELAYED_RELEASE_TABLET | Freq: Every day | ORAL | Status: DC | PRN

## 2021-02-04 MED ORDER — ATORVASTATIN CALCIUM 10 MG PO TABS
10.0000 mg | ORAL_TABLET | Freq: Every day | ORAL | Status: DC
  Filled 2021-02-04: qty 1

## 2021-02-04 MED ORDER — VANCOMYCIN HCL IN DEXTROSE 1-5 GM/200ML-% IV SOLN
INTRAVENOUS | Status: AC
  Administered 2021-02-04: 1000 mg via INTRAVENOUS
  Filled 2021-02-04: qty 200

## 2021-02-04 MED ORDER — PROTAMINE SULFATE 10 MG/ML IV SOLN
INTRAVENOUS | Status: DC | PRN
  Administered 2021-02-04: 5 mg via INTRAVENOUS
  Administered 2021-02-04 (×2): 10 mg via INTRAVENOUS

## 2021-02-04 MED ORDER — SODIUM CHLORIDE 0.9 % IV SOLN: 500.0000 mL | Freq: Once | INTRAVENOUS | Status: DC | PRN

## 2021-02-04 MED ORDER — LABETALOL HCL 5 MG/ML IV SOLN
INTRAVENOUS | Status: AC
  Administered 2021-02-04: 10 mg via INTRAVENOUS
  Filled 2021-02-04: qty 4

## 2021-02-04 MED ORDER — METOPROLOL TARTRATE 5 MG/5ML IV SOLN: 2.0000 mg | INTRAVENOUS | Status: DC | PRN

## 2021-02-04 MED ORDER — ONDANSETRON HCL 4 MG/2ML IJ SOLN
INTRAMUSCULAR | Status: AC
  Filled 2021-02-04: qty 2

## 2021-02-04 MED ORDER — CHLORHEXIDINE GLUCONATE CLOTH 2 % EX PADS: 6.0000 | MEDICATED_PAD | Freq: Once | CUTANEOUS | Status: DC

## 2021-02-04 MED ORDER — POVIDONE-IODINE 7.5 % EX SOLN
CUTANEOUS | Status: DC | PRN
  Administered 2021-02-04: 1 via TOPICAL

## 2021-02-04 MED ORDER — ROCURONIUM BROMIDE 10 MG/ML (PF) SYRINGE
PREFILLED_SYRINGE | INTRAVENOUS | Status: AC
  Filled 2021-02-04: qty 10

## 2021-02-04 MED ORDER — ROCURONIUM BROMIDE 10 MG/ML (PF) SYRINGE
PREFILLED_SYRINGE | INTRAVENOUS | Status: DC | PRN
  Administered 2021-02-04: 10 mg via INTRAVENOUS
  Administered 2021-02-04: 30 mg via INTRAVENOUS
  Administered 2021-02-04: 60 mg via INTRAVENOUS

## 2021-02-04 MED ORDER — PROPOFOL 10 MG/ML IV BOLUS
INTRAVENOUS | Status: AC
  Filled 2021-02-04: qty 20

## 2021-02-04 MED ORDER — HEPARIN 6000 UNIT IRRIGATION SOLUTION
Status: AC
  Filled 2021-02-04: qty 500

## 2021-02-04 MED ORDER — VANCOMYCIN HCL IN DEXTROSE 1-5 GM/200ML-% IV SOLN: 1000.0000 mg | Freq: Once | INTRAVENOUS | Status: AC

## 2021-02-04 MED ORDER — ACETAMINOPHEN 500 MG PO TABS
1000.0000 mg | ORAL_TABLET | Freq: Once | ORAL | Status: AC
  Administered 2021-02-04: 1000 mg via ORAL
  Filled 2021-02-04: qty 2

## 2021-02-04 MED ORDER — PROPOFOL 10 MG/ML IV BOLUS
INTRAVENOUS | Status: DC | PRN
  Administered 2021-02-04: 130 mg via INTRAVENOUS

## 2021-02-04 MED ORDER — POTASSIUM CHLORIDE CRYS ER 20 MEQ PO TBCR: 20.0000 meq | EXTENDED_RELEASE_TABLET | Freq: Every day | ORAL | Status: DC | PRN

## 2021-02-04 MED ORDER — PHENYLEPHRINE HCL-NACL 20-0.9 MG/250ML-% IV SOLN
INTRAVENOUS | Status: DC | PRN
  Administered 2021-02-04: 25 ug/min via INTRAVENOUS

## 2021-02-04 MED ORDER — ALUM & MAG HYDROXIDE-SIMETH 200-200-20 MG/5ML PO SUSP: 15.0000 mL | ORAL | Status: DC | PRN

## 2021-02-04 MED ORDER — DEXAMETHASONE SODIUM PHOSPHATE 10 MG/ML IJ SOLN
INTRAMUSCULAR | Status: DC | PRN
  Administered 2021-02-04: 8 mg via INTRAVENOUS

## 2021-02-04 MED ORDER — ONDANSETRON HCL 4 MG/2ML IJ SOLN: 4.0000 mg | Freq: Four times a day (QID) | INTRAMUSCULAR | Status: DC | PRN

## 2021-02-04 MED ORDER — LABETALOL HCL 5 MG/ML IV SOLN
10.0000 mg | INTRAVENOUS | Status: DC | PRN
  Administered 2021-02-04 – 2021-02-06 (×2): 10 mg via INTRAVENOUS
  Filled 2021-02-04 (×3): qty 4

## 2021-02-04 MED ORDER — SODIUM CHLORIDE 0.9 % IV SOLN: INTRAVENOUS | Status: DC

## 2021-02-04 MED ORDER — ONDANSETRON HCL 4 MG/2ML IJ SOLN: 4.0000 mg | Freq: Once | INTRAMUSCULAR | Status: DC | PRN

## 2021-02-04 MED ORDER — LACTATED RINGERS IV SOLN: INTRAVENOUS | Status: DC

## 2021-02-04 MED ORDER — FENTANYL CITRATE (PF) 100 MCG/2ML IJ SOLN
INTRAMUSCULAR | Status: DC | PRN
  Administered 2021-02-04 (×3): 25 ug via INTRAVENOUS
  Administered 2021-02-04: 75 ug via INTRAVENOUS

## 2021-02-04 MED ORDER — SUGAMMADEX SODIUM 200 MG/2ML IV SOLN
INTRAVENOUS | Status: DC | PRN
  Administered 2021-02-04: 200 mg via INTRAVENOUS

## 2021-02-04 MED ORDER — DOCUSATE SODIUM 100 MG PO CAPS
100.0000 mg | ORAL_CAPSULE | Freq: Every day | ORAL | Status: DC
  Administered 2021-02-05 – 2021-02-06 (×2): 100 mg via ORAL
  Filled 2021-02-04 (×2): qty 1

## 2021-02-04 MED ORDER — HEMOSTATIC AGENTS (NO CHARGE) OPTIME
TOPICAL | Status: DC | PRN
  Administered 2021-02-04: 1 via TOPICAL

## 2021-02-04 MED ORDER — FENTANYL CITRATE (PF) 250 MCG/5ML IJ SOLN
INTRAMUSCULAR | Status: AC
  Filled 2021-02-04: qty 5

## 2021-02-04 MED ORDER — GUAIFENESIN-DM 100-10 MG/5ML PO SYRP: 15.0000 mL | ORAL_SOLUTION | ORAL | Status: DC | PRN

## 2021-02-04 MED ORDER — STERILE WATER FOR IRRIGATION IR SOLN
Status: DC | PRN
  Administered 2021-02-04: 1000 mL

## 2021-02-04 MED ORDER — ORAL CARE MOUTH RINSE: 15.0000 mL | Freq: Once | OROMUCOSAL | Status: AC

## 2021-02-04 MED ORDER — HEPARIN 6000 UNIT IRRIGATION SOLUTION
Status: DC | PRN
  Administered 2021-02-04: 1

## 2021-02-04 MED ORDER — ACETAMINOPHEN 650 MG RE SUPP: 325.0000 mg | RECTAL | Status: DC | PRN

## 2021-02-04 MED ORDER — FENTANYL CITRATE (PF) 100 MCG/2ML IJ SOLN: 25.0000 ug | INTRAMUSCULAR | Status: DC | PRN

## 2021-02-04 MED ORDER — ONDANSETRON HCL 4 MG/2ML IJ SOLN
INTRAMUSCULAR | Status: DC | PRN
  Administered 2021-02-04: 4 mg via INTRAVENOUS

## 2021-02-04 MED ORDER — HYDRALAZINE HCL 20 MG/ML IJ SOLN
5.0000 mg | INTRAMUSCULAR | Status: AC | PRN
  Administered 2021-02-04: 5 mg via INTRAVENOUS
  Filled 2021-02-04 (×2): qty 0.25

## 2021-02-04 MED ORDER — ACETAMINOPHEN 325 MG PO TABS: 325.0000 mg | ORAL_TABLET | ORAL | Status: DC | PRN

## 2021-02-04 MED ORDER — IODIXANOL 320 MG/ML IV SOLN
INTRAVENOUS | Status: DC | PRN
  Administered 2021-02-04: 77 mL

## 2021-02-04 MED ORDER — PHENOL 1.4 % MT LIQD: 1.0000 | OROMUCOSAL | Status: DC | PRN

## 2021-02-04 MED ORDER — CHLORHEXIDINE GLUCONATE 0.12 % MT SOLN
15.0000 mL | Freq: Once | OROMUCOSAL | Status: AC
  Administered 2021-02-04: 15 mL via OROMUCOSAL
  Filled 2021-02-04: qty 15

## 2021-02-04 MED ORDER — CEFAZOLIN SODIUM-DEXTROSE 2-4 GM/100ML-% IV SOLN
2.0000 g | INTRAVENOUS | Status: AC
  Administered 2021-02-04: 2 g via INTRAVENOUS

## 2021-02-04 MED ORDER — OXYCODONE-ACETAMINOPHEN 5-325 MG PO TABS: 1.0000 | ORAL_TABLET | ORAL | Status: DC | PRN

## 2021-02-04 SURGICAL SUPPLY — 87 items
BAG COUNTER SPONGE SURGICOUNT (BAG) ×2 IMPLANT
BAG SNAP BAND KOVER 36X36 (MISCELLANEOUS) ×3 IMPLANT
BAG SURGICOUNT SPONGE COUNTING (BAG) ×1
BALLN MUSTANG 5X100X135 (BALLOONS) ×3
BALLOON MUSTANG 5X100X135 (BALLOONS) ×1 IMPLANT
BLADE SURG 11 STRL SS (BLADE) ×3 IMPLANT
CANISTER SUCT 3000ML PPV (MISCELLANEOUS) ×3 IMPLANT
CATH ANGIO 5F BER2 65CM (CATHETERS) IMPLANT
CATH BEACON 5 .035 65 KMP TIP (CATHETERS) IMPLANT
CATH OMNI FLUSH .035X70CM (CATHETERS) IMPLANT
CATH OMNI FLUSH 5F 65CM (CATHETERS) IMPLANT
CATH QUICKCROSS SUPP .035X90CM (MICROCATHETER) ×3 IMPLANT
CHLORAPREP W/TINT 26 (MISCELLANEOUS) IMPLANT
CLIP LIGATING EXTRA MED SLVR (CLIP) ×3 IMPLANT
CLIP LIGATING EXTRA SM BLUE (MISCELLANEOUS) ×3 IMPLANT
CLIP VESOCCLUDE SM WIDE 24/CT (CLIP) IMPLANT
COVER DOME SNAP 22 D (MISCELLANEOUS) ×3 IMPLANT
COVER PROBE W GEL 5X96 (DRAPES) ×3 IMPLANT
COVER SURGICAL LIGHT HANDLE (MISCELLANEOUS) ×3 IMPLANT
DERMABOND ADVANCED (GAUZE/BANDAGES/DRESSINGS) ×2
DERMABOND ADVANCED .7 DNX12 (GAUZE/BANDAGES/DRESSINGS) ×1 IMPLANT
DEVICE TORQUE KENDALL .025-038 (MISCELLANEOUS) ×3 IMPLANT
DRAIN CHANNEL 15F RND FF W/TCR (WOUND CARE) IMPLANT
DRAPE C-ARM 42X72 X-RAY (DRAPES) ×3 IMPLANT
DRAPE C-ARMOR (DRAPES) ×3 IMPLANT
DRAPE FEMORAL ANGIO 80X135IN (DRAPES) ×3 IMPLANT
ELECT REM PT RETURN 9FT ADLT (ELECTROSURGICAL) ×3
ELECTRODE REM PT RTRN 9FT ADLT (ELECTROSURGICAL) ×1 IMPLANT
EVACUATOR SILICONE 100CC (DRAIN) IMPLANT
GAUZE 4X4 16PLY ~~LOC~~+RFID DBL (SPONGE) ×3 IMPLANT
GLIDEWIRE ADV .035X180CM (WIRE) ×3 IMPLANT
GLOVE SURG ENC MOIS LTX SZ7.5 (GLOVE) ×3 IMPLANT
GLOVE SURG MICRO LTX SZ7.5 (GLOVE) ×3 IMPLANT
GLOVE SURG PR MICRO ENCORE 7 (GLOVE) ×6 IMPLANT
GLOVE SURG PR MICRO ENCORE 7.5 (GLOVE) ×6 IMPLANT
GLOVE SURG UNDER POLY LF SZ7 (GLOVE) ×3 IMPLANT
GOWN STRL REUS W/ TWL LRG LVL3 (GOWN DISPOSABLE) ×4 IMPLANT
GOWN STRL REUS W/ TWL XL LVL3 (GOWN DISPOSABLE) ×3 IMPLANT
GOWN STRL REUS W/TWL LRG LVL3 (GOWN DISPOSABLE) ×12
GOWN STRL REUS W/TWL XL LVL3 (GOWN DISPOSABLE) ×9
GUIDEWIRE ANGLED .035X150CM (WIRE) IMPLANT
KIT BASIN OR (CUSTOM PROCEDURE TRAY) ×3 IMPLANT
KIT ENCORE 26 ADVANTAGE (KITS) IMPLANT
KIT TURNOVER KIT B (KITS) ×3 IMPLANT
NEEDLE PERC 18GX7CM (NEEDLE) IMPLANT
NS IRRIG 1000ML POUR BTL (IV SOLUTION) ×3 IMPLANT
PACK ENDO MINOR (CUSTOM PROCEDURE TRAY) IMPLANT
PACK PERIPHERAL VASCULAR (CUSTOM PROCEDURE TRAY) ×3 IMPLANT
PACK SURGICAL SETUP 50X90 (CUSTOM PROCEDURE TRAY) ×3 IMPLANT
PAD ARMBOARD 7.5X6 YLW CONV (MISCELLANEOUS) ×6 IMPLANT
POWDER SURGICEL 3.0 GRAM (HEMOSTASIS) ×3 IMPLANT
PROTECTION STATION PRESSURIZED (MISCELLANEOUS) ×3
SET MICROPUNCTURE 5F STIFF (MISCELLANEOUS) ×3 IMPLANT
SHEATH AVANTI 11CM 5FR (SHEATH) IMPLANT
SHEATH BRITE TIP 8FR 23CM (SHEATH) ×3 IMPLANT
SHEATH PINNACLE 8F 10CM (SHEATH) IMPLANT
SHEATH PINNACLE ST 6F 45CM (SHEATH) ×3 IMPLANT
SPONGE T-LAP 18X18 ~~LOC~~+RFID (SPONGE) ×6 IMPLANT
STATION PROTECTION PRESSURIZED (MISCELLANEOUS) ×1 IMPLANT
STENT ELUVIA 6X120X130 (Permanent Stent) ×3 IMPLANT
STENT VIABAHNBX 10X39X135 (Permanent Stent) ×3 IMPLANT
STOPCOCK MORSE 400PSI 3WAY (MISCELLANEOUS) ×3 IMPLANT
SUT ETHILON 3 0 PS 1 (SUTURE) ×3 IMPLANT
SUT MNCRL AB 4-0 PS2 18 (SUTURE) ×6 IMPLANT
SUT PROLENE 5 0 C 1 24 (SUTURE) ×24 IMPLANT
SUT PROLENE 6 0 BV (SUTURE) ×6 IMPLANT
SUT SILK 2 0 SH (SUTURE) ×3 IMPLANT
SUT VIC AB 2-0 CT1 27 (SUTURE) ×6
SUT VIC AB 2-0 CT1 TAPERPNT 27 (SUTURE) ×2 IMPLANT
SUT VIC AB 3-0 SH 27 (SUTURE) ×3
SUT VIC AB 3-0 SH 27X BRD (SUTURE) ×1 IMPLANT
SYR 10ML LL (SYRINGE) ×9 IMPLANT
SYR 20ML LL LF (SYRINGE) ×3 IMPLANT
SYR 30ML LL (SYRINGE) ×3 IMPLANT
SYR MEDRAD MARK V 150ML (SYRINGE) ×3 IMPLANT
TAPE VIPERTRACK RADIOPAQ 30X (MISCELLANEOUS) ×1 IMPLANT
TAPE VIPERTRACK RADIOPAQUE (MISCELLANEOUS) ×3
TOWEL GREEN STERILE (TOWEL DISPOSABLE) ×6 IMPLANT
TOWEL GREEN STERILE FF (TOWEL DISPOSABLE) ×3 IMPLANT
TUBING HIGH PRESSURE 120CM (CONNECTOR) ×3 IMPLANT
UNDERPAD 30X36 HEAVY ABSORB (UNDERPADS AND DIAPERS) IMPLANT
WATER STERILE IRR 1000ML POUR (IV SOLUTION) ×3 IMPLANT
WIRE AMPLATZ SS-J .035X180CM (WIRE) IMPLANT
WIRE AMPLATZ SS-J .035X260CM (WIRE) IMPLANT
WIRE BENTSON .035X145CM (WIRE) ×3 IMPLANT
WIRE HI TORQ VERSACORE J 260CM (WIRE) ×3 IMPLANT
WIRE TORQFLEX AUST .018X40CM (WIRE) ×3 IMPLANT

## 2021-02-04 NOTE — Progress Notes (Signed)
   S/P  1.  Left common femoral endarterectomy with vein patch angioplasty 2.  Harvest left greater saphenous vein 3.  Aortogram with left lower extremity angiogram 4.  Stent of left common iliac artery with 10 x 39 mm VBX 5.  Stent of left SFA with 6 x 120 mm Elluvia  Left groin soft without hematoma Doppler signals PT/DP/Peroneal left LE Lateral leg wound dry  Stable post op Ortho consult  Mosetta Pigeon PA-C

## 2021-02-04 NOTE — Op Note (Signed)
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 Randie Heinz, MD Assistant: Clinton Gallant, PA Procedure Performed: 1.  Left common femoral endarterectomy with vein patch angioplasty 2.  Harvest left greater saphenous vein 3.  Aortogram with left lower extremity angiogram 4.  Stent of left common iliac artery with 10 x 39 mm VBX 5.  Stent of left SFA with 6 x 120 mm Elluvia   Indications: 76 year old male with history of chronic limb threatening ischemia.  He has ABIs that are depressed bilaterally actually lower than the right but he has wounds on the left.  He has ulcerations that appear to be mixed venous and arterial in nature.  He has undergone CT angio which demonstrates disease of his left common iliac artery with occluded right common iliac artery.  He has disease of the common femoral artery and occluded SFA.  He is indicated for the above-noted procedure.  An assistant was necessary to facilitate exposure and expedite the case.  Findings: The common femoral artery itself was soft with significant posterior plaque approximately 50% of the lumen.  After endarterectomy there was good backbleeding from the SFA and the profunda and the inflow was improved as we extend the endarterectomy up into the external iliac artery.  The common iliac artery on the left was stenosed for approximately 3 cm to a total of 70%.  After stenting was reduced to 0%.  The left SFA was initially patent down to the level of the abductor where it occluded for approximately 10 cm.  After stenting with drug-eluting stent this was reduced to 0%.  Runoff is via all 3 vessels however dominant appears to be the posterior tibial artery.   Procedure:  The patient was identified in the holding area and taken to the operating was placed supine on upper  table and general anesthesia was induced.  He was sterilely prepped and draped in the left groin and abdomen in the usual fashion, antibiotics were ministered a timeout was called.  Transverse incision was made.  We dissected down to the common femoral artery which was noted to be soft with minimal pulsatility.  We dissected up onto the inguinal ligament divided the crossing vein placed Vesseloops around this.  We also placed a vessel loop around the profunda and the SFA.  Through the same incision we identified the greater saphenous vein and saphenofemoral junction.  We dissected the saphenous vein out for several centimeters that did appear to be quite large.  We made a counterincision in the groin distally on the medial thigh and dissected down to the vein.  After we harvested approximately 10 cm of vein we clamped it distally tied it off.  At the saphenofemoral junction we then clamped it divided.  We oversewed the 7 femoral junction with 5-0 Prolene suture in a running mattress fashion.  At this time the patient was fully heparinized.  We clamped the profunda followed by the SFA and the external leg artery.  We opened the vessel longitudinally.  There was significantly posterior plaque that did appear to be ulcerated.  Extensive endarterectomy was performed including up onto the inguinal ligament.  We had good backbleeding from the profunda and had a smooth tapering at the level of the SFA.  The vein patch was then opened and reversed and sewn in place as a patch as 5-0 Prolene  suture.  Prior to completion without flushing all directions.  Upon completion there was improved pulsatility in the common femoral artery.  We then prepared for the angiographic portion of the case.  We cannulated the vein patch in a retrograde fashion with a micropuncture needle followed by wire and sheath.  We then placed a long Glidewire advantage into the aorta.  We placed an 8 French sheath performed aortogram.  We primarily  stented the left common iliac artery with a 10 x 39 mm VBX.  Completion demonstrated no residual stenosis.  We suture-ligated the 8 French cannulation site.  We turned our attention distally.  We cannulated the distal patch heading into the SFA with micropuncture needle followed by wire and sheath.  We watched this under fluoroscopy.  We placed a Glidewire advantage down to the SFA where it appeared to occlude.  We then placed a long 6 French sheath down to the mid SFA.  This was sutured to the drapes to have an open over type approach.  We performed angiography of the left lower extremity.  We were able to cross the occluded SFA with Glidewire advantage and quick cross catheter and confirmed intraluminal access.  We then placed the versa core wire.  We primarily stented with a 6 mm drug-eluting stent.  This was postdilated with 5 mm balloon.  Completion demonstrated no residual stenosis.  There was three-vessel runoff to the foot appeared to be most brisk through the posterior tibial.  There were good signals in the profunda and the SFA.  Again we remove the sheath and suture-ligated the cannulation site.  We obtain hemostasis in the wounds.  We administered 25 mg of protamine.  The wounds were irrigated and hemostasis obtained.  We closed in layers of Vicryl and Monocryl.  Dermabond was placed to level the skin.  He was awakened from anesthesia having tolerated procedure without any complication.  All counts were correct at completion.  Contrast: 75cc  EBL: 550cc  Tauna Macfarlane C. Randie Heinz, MD [facility] Office: [phone] Pager: [phone]

## 2021-02-04 NOTE — Consult Note (Signed)
Reason for Consult:Right hip proud screw Referring Physician: [name] Time called: 1143 Time at bedside: 1410   Francis Mckenzie is an 75 y.o. male.  HPI: Francis Mckenzie was having RLE vascular surgery today and  was noted to have a screw protruding from his hip during prep and orthopedic surgery was consulted. He had that hip fixed in 1999 and it has not given him any problems. He denies pain but does admit to some intermittent discharge over the last 3-4 months.  Past Medical History:  Diagnosis Date   Peripheral vascular disease (HCC)     Past Surgical History:  Procedure Laterality Date   HIP SURGERY Right     Family History  Problem Relation Age of Onset   Heart attack Sister     Social History:  reports that he has quit smoking. His smoking use included cigarettes. He has never used smokeless tobacco. He reports current alcohol use of about 14.0 standard drinks per week. He reports that he does not use drugs.  Allergies:  Allergies  Allergen Reactions   Aspirin Rash    Medications: I have reviewed the patient's current medications.  Results for orders placed or performed during the hospital encounter of 02/04/21 (from the past 48 hour(s))  ABO/Rh     Status: None   Collection Time: 02/04/21  8:05 AM  Result Value Ref Range   ABO/RH(D)      A POS Performed at [facility], [address], [place], [place] [address]     DG C-Arm 1-60 Min-No Report  Result Date: 02/04/2021 Fluoroscopy was utilized by the requesting physician.  No radiographic interpretation.   DG FEMUR MIN 2 VIEWS LEFT  Result Date: 02/04/2021 CLINICAL DATA:  Peripheral arterial disease EXAM: LEFT FEMUR 2 VIEWS; DG C-ARM 1-60 MIN-NO REPORT COMPARISON:  CTA 01/17/2021 FINDINGS: Submitted a series of intra procedural fluoroscopic images documenting left lower extremity arteriography. A distal SFA and proximal popliteal occlusion is identified, with subsequent stenting. IMPRESSION: Fluoroscopy  provided for distal SFA stenting. Electronically Signed   By: Corlis Leak M.D.   On: 02/04/2021 13:04   HYBRID OR IMAGING ([facility] ONLY)  Result Date: 02/04/2021 There is no interpretation for this exam.  This order is for images obtained during a surgical procedure.  Please See "Surgeries" Tab for more information regarding the procedure.    Review of Systems  HENT:  Negative for ear discharge, ear pain, hearing loss and tinnitus.   Eyes:  Negative for photophobia and pain.  Respiratory:  Negative for cough and shortness of breath.   Cardiovascular:  Negative for chest pain.  Gastrointestinal:  Negative for abdominal pain, nausea and vomiting.  Genitourinary:  Negative for dysuria, flank pain, frequency and urgency.  Musculoskeletal:  Negative for arthralgias, back pain, myalgias and neck pain.  Neurological:  Negative for dizziness and headaches.  Hematological:  Does not bruise/bleed easily.  Psychiatric/Behavioral:  The patient is not nervous/anxious.   Blood pressure (!) 164/74, pulse 63, temperature 97.9 F (36.6 C), resp. rate 19, height 6' (1.829 m), weight 88.5 kg, SpO2 96 %. Physical Exam Constitutional:      General: He is not in acute distress.    Appearance: He is well-developed. He is not diaphoretic.  HENT:     Head: Normocephalic and atraumatic.  Eyes:     General: No scleral icterus.       Right eye: No discharge.        Left eye: No discharge.  Conjunctiva/sclera: Conjunctivae normal.  Cardiovascular:     Rate and Rhythm: Normal rate and regular rhythm.  Pulmonary:     Effort: Pulmonary effort is normal. No respiratory distress.  Musculoskeletal:     Cervical back: Normal range of motion.     Comments: RLE No traumatic wounds, ecchymosis, or rash  Hip surgical incision with protruding screw head, NT  No knee or ankle effusion  Knee stable to varus/ valgus and anterior/posterior stress  Sens DPN, SPN, TN intact  Motor EHL, ext, flex, evers 5/5  DP 0, PT 0, No  significant edema  Skin:    General: Skin is warm and dry.  Neurological:     Mental Status: He is alert.  Psychiatric:        Mood and Affect: Mood normal.        Behavior: Behavior normal.    Assessment/Plan: S/p right hip ORIF with protruding hardware -- Needs to return to [facility] ortho trauma for removal. This should be done in a timely but not urgent time frame. In meantime wash daily with soap and water in shower, no soaking, and cover with dry dressing.    Freeman Caldron, PA-C Orthopedic Surgery [phone] 02/04/2021, 2:15 PM

## 2021-02-04 NOTE — Progress Notes (Signed)
MD Desmond Lope notified about pt's BP's: 202/77, 195/86, and 184/78. No new orders at this time.

## 2021-02-04 NOTE — Anesthesia Procedure Notes (Signed)
Procedure Name: Intubation Date/Time: 02/04/2021 9:37 AM Performed by: Flossie Dibble, CRNA Pre-anesthesia Checklist: Patient identified, Patient being monitored, Timeout performed, Emergency Drugs available and Suction available Patient Re-evaluated:Patient Re-evaluated prior to induction Oxygen Delivery Method: Circle System Utilized Preoxygenation: Pre-oxygenation with 100% oxygen Induction Type: IV induction Ventilation: Mask ventilation without difficulty and Oral airway inserted - appropriate to patient size Laryngoscope Size: Mac and 4 Grade View: Grade I Tube type: Oral Tube size: 7.5 mm Number of attempts: 1 Airway Equipment and Method: stylet Placement Confirmation: ETT inserted through vocal cords under direct vision, positive ETCO2 and breath sounds checked- equal and bilateral Secured at: 22 cm Tube secured with: Tape Dental Injury: Teeth and Oropharynx as per pre-operative assessment

## 2021-02-04 NOTE — Transfer of Care (Signed)
Immediate Anesthesia Transfer of Care Note  Patient: Francis Mckenzie  Procedure(s) Performed: LEFT COMMON FEMORAL ENDARTERECTOMY (Left) INSERTION OF RETROGRADE LEFT ILIAC STENT (Left) LEFT LOWER EXTREMITY ANGIOGRAM (Left) LEFT SUPERFICIAL FEMORAL ARTERY STENT (Left)  Patient Location: PACU  Anesthesia Type:General  Level of Consciousness: awake, alert  and oriented  Airway & Oxygen Therapy: Patient Spontanous Breathing  Post-op Assessment: Report given to RN and Post -op Vital signs reviewed and stable  Post vital signs: Reviewed and stable  Last Vitals:  Vitals Value Taken Time  BP 170/80 02/04/21 1311  Temp 36.6 C 02/04/21 1309  Pulse 71 02/04/21 1313  Resp 16 02/04/21 1313  SpO2 94 % 02/04/21 1313  Vitals shown include unvalidated device data.  Last Pain:  Vitals:   02/04/21 0759  TempSrc:   PainSc: 0-No pain         Complications: No notable events documented.

## 2021-02-04 NOTE — Progress Notes (Signed)
Notified Dr. Randie Heinz that pt tested positive for MRSA in PAT. Per Dr. Randie Heinz, okay to administer Vancomycin 1g IV.

## 2021-02-04 NOTE — Interval H&P Note (Signed)
History and Physical Interval Note:  02/04/2021 7:33 AM  Francis Mckenzie  has presented today for surgery, with the diagnosis of PAD.  The various methods of treatment have been discussed with the patient and family. After consideration of risks, benefits and other options for treatment, the patient has consented to  Procedure(s): LEFT COMMON FEMORAL ENDARTERECTOMY (Left) INSERTION OF RETROGRADE LEFT ILIAC STENT (Left) LEFT LOWER EXTREMITY ANGIOGRAM (Left) as a surgical intervention.  The patient's history has been reviewed, patient examined, no change in status, stable for surgery.  I have reviewed the patient's chart and labs.  Questions were answered to the patient's satisfaction.     [name]

## 2021-02-04 NOTE — Anesthesia Postprocedure Evaluation (Signed)
Anesthesia Post Note  Patient: Francis Mckenzie  Procedure(s) Performed: LEFT COMMON FEMORAL ENDARTERECTOMY (Left) INSERTION OF RETROGRADE LEFT ILIAC STENT (Left) LEFT LOWER EXTREMITY ANGIOGRAM (Left) LEFT SUPERFICIAL FEMORAL ARTERY STENT (Left)     Patient location during evaluation: PACU Anesthesia Type: General Level of consciousness: awake and alert Pain management: pain level controlled Vital Signs Assessment: post-procedure vital signs reviewed and stable Respiratory status: spontaneous breathing, nonlabored ventilation and respiratory function stable Cardiovascular status: blood pressure returned to baseline and stable Postop Assessment: no apparent nausea or vomiting Anesthetic complications: no   No notable events documented.  Last Vitals:  Vitals:   02/04/21 2000 02/04/21 2039  BP: 139/72 139/72  Pulse:  72  Resp: 16 20  Temp:  36.8 C  SpO2:  99%    Last Pain:  Vitals:   02/04/21 2039  TempSrc: Oral  PainSc:                  Cecile Hearing

## 2021-02-05 ENCOUNTER — Encounter (HOSPITAL_COMMUNITY): Payer: Self-pay | Admitting: Vascular Surgery

## 2021-02-05 DIAGNOSIS — I472 Ventricular tachycardia: Secondary | ICD-10-CM

## 2021-02-05 DIAGNOSIS — I739 Peripheral vascular disease, unspecified: Secondary | ICD-10-CM

## 2021-02-05 LAB — CBC
HCT: 39.5 % (ref 39.0–52.0)
Hemoglobin: 12.9 g/dL — ABNORMAL LOW (ref 13.0–17.0)
MCH: 30.6 pg (ref 26.0–34.0)
MCHC: 32.7 g/dL (ref 30.0–36.0)
MCV: 93.6 fL (ref 80.0–100.0)
Platelets: 186 10*3/uL (ref 150–400)
RBC: 4.22 MIL/uL (ref 4.22–5.81)
RDW: 13 % (ref 11.5–15.5)
WBC: 10.2 10*3/uL (ref 4.0–10.5)
nRBC: 0 % (ref 0.0–0.2)

## 2021-02-05 LAB — BASIC METABOLIC PANEL
Anion gap: 10 (ref 5–15)
BUN: 8 mg/dL (ref 8–23)
CO2: 21 mmol/L — ABNORMAL LOW (ref 22–32)
Calcium: 8.1 mg/dL — ABNORMAL LOW (ref 8.9–10.3)
Chloride: 103 mmol/L (ref 98–111)
Creatinine, Ser: 1 mg/dL (ref 0.61–1.24)
GFR, Estimated: >60 mL/min (ref >60)
Glucose, Bld: 147 mg/dL — ABNORMAL HIGH (ref 70–99)
Potassium: 4.1 mmol/L (ref 3.5–5.1)
Sodium: 134 mmol/L — ABNORMAL LOW (ref 135–145)

## 2021-02-05 LAB — LIPID PANEL
Cholesterol: 116 mg/dL (ref 0–200)
HDL: 35 mg/dL — ABNORMAL LOW (ref >40)
LDL Cholesterol: 66 mg/dL (ref 0–99)
Total CHOL/HDL Ratio: 3.3 RATIO
Triglycerides: 74 mg/dL (ref <150)
VLDL: 15 mg/dL (ref 0–40)

## 2021-02-05 LAB — COMPREHENSIVE METABOLIC PANEL
ALT: 11 U/L (ref 0–44)
AST: 15 U/L (ref 15–41)
Albumin: 3.4 g/dL — ABNORMAL LOW (ref 3.5–5.0)
Alkaline Phosphatase: 71 U/L (ref 38–126)
Anion gap: 8 (ref 5–15)
BUN: 8 mg/dL (ref 8–23)
CO2: 23 mmol/L (ref 22–32)
Calcium: 8.6 mg/dL — ABNORMAL LOW (ref 8.9–10.3)
Chloride: 107 mmol/L (ref 98–111)
Creatinine, Ser: 0.92 mg/dL (ref 0.61–1.24)
GFR, Estimated: >60 mL/min (ref >60)
Glucose, Bld: 101 mg/dL — ABNORMAL HIGH (ref 70–99)
Potassium: 3.7 mmol/L (ref 3.5–5.1)
Sodium: 138 mmol/L (ref 135–145)
Total Bilirubin: 1.7 mg/dL — ABNORMAL HIGH (ref 0.3–1.2)
Total Protein: 6.3 g/dL — ABNORMAL LOW (ref 6.5–8.1)

## 2021-02-05 LAB — POCT ACTIVATED CLOTTING TIME
Activated Clotting Time: 225 seconds
Activated Clotting Time: 260 seconds

## 2021-02-05 LAB — PHOSPHORUS: Phosphorus: 2.5 mg/dL (ref 2.5–4.6)

## 2021-02-05 LAB — MAGNESIUM: Magnesium: 1.9 mg/dL (ref 1.7–2.4)

## 2021-02-05 MED ORDER — MAGNESIUM SULFATE 2 GM/50ML IV SOLN
2.0000 g | Freq: Once | INTRAVENOUS | Status: AC
  Administered 2021-02-05: 2 g via INTRAVENOUS
  Filled 2021-02-05: qty 50

## 2021-02-05 MED ORDER — ATORVASTATIN CALCIUM 40 MG PO TABS
40.0000 mg | ORAL_TABLET | Freq: Every day | ORAL | Status: DC
  Administered 2021-02-05 – 2021-02-06 (×2): 40 mg via ORAL
  Filled 2021-02-05 (×2): qty 1

## 2021-02-05 NOTE — Progress Notes (Addendum)
Progress Note    02/05/2021 11:13 AM 1 Day Post-Op  Subjective:  Denies pain. Has ambulated. Tolerated diet. Voiding spontaneously. One run of v tach after exam. BP stable, no CP or SOB.   Vitals:   02/05/21 0400 02/05/21 0800  BP: 132/69 (!) 156/66  Pulse:  64  Resp: 17 18  Temp: 98.5 F (36.9 C) 98 F (36.7 C)  SpO2: 94% 100%    Physical Exam: General appearance: Awake, alert in no apparent distress Cardiac: Heart rate and rhythm are regular Respirations: Nonlabored Incisions: Left groin, thigh incisions are all well approximated without bleeding or hematoma Extremities: Both feet are warm with intact sensation and motor function.   Pulse/Doppler exam:  Brisk dorsalis pedis, posterior tibial artery Doppler signals    CBC    Component Value Date/Time   WBC 10.2 02/05/2021 0151   RBC 4.22 02/05/2021 0151   HGB 12.9 (L) 02/05/2021 0151   HCT 39.5 02/05/2021 0151   PLT 186 02/05/2021 0151   MCV 93.6 02/05/2021 0151   MCH 30.6 02/05/2021 0151   MCHC 32.7 02/05/2021 0151   RDW 13.0 02/05/2021 0151    BMET    Component Value Date/Time   NA 134 (L) 02/05/2021 0151   K 4.1 02/05/2021 0151   CL 103 02/05/2021 0151   CO2 21 (L) 02/05/2021 0151   GLUCOSE 147 (H) 02/05/2021 0151   BUN 8 02/05/2021 0151   CREATININE 1.00 02/05/2021 0151   CALCIUM 8.1 (L) 02/05/2021 0151   GFRNONAA >60 02/05/2021 0151     Intake/Output Summary (Last 24 hours) at 02/05/2021 1113 Last data filed at 02/05/2021 0900 Gross per 24 hour  Intake 2368.08 ml  Output 3450 ml  Net -1081.92 ml    HOSPITAL MEDICATIONS Scheduled Meds:  atorvastatin  10 mg Oral q1800   clopidogrel  75 mg Oral Q0600   docusate sodium  100 mg Oral Daily   enoxaparin (LOVENOX) injection  30 mg Subcutaneous Q24H   pantoprazole  40 mg Oral Daily   Continuous Infusions:  sodium chloride     sodium chloride 100 mL/hr at 02/05/21 0000   magnesium sulfate bolus IVPB     PRN Meds:.sodium chloride,  acetaminophen **OR** acetaminophen, alum & mag hydroxide-simeth, bisacodyl, guaiFENesin-dextromethorphan, HYDROmorphone (DILAUDID) injection, labetalol, magnesium sulfate bolus IVPB, metoprolol tartrate, ondansetron, oxyCODONE-acetaminophen, phenol, potassium chloride, senna-docusate  Assessment and Plan: POD 1 left common femoral endarterectomy with left greater saphenous vein patch angioplasty.  He also underwent VBX stent of left common iliac artery and Elluvia stent of left superficial femoral artery.   He is afebrile.  He is ambulating without difficulty.  He has intact motor function and normal Doppler signals bilaterally. Continue statin and Plavix.  RN noted 24 beat ventricular tachycardia without symptoms. Will check electrolyte panel, mag sulfate 2g IV now and consult cardiology>>spoke with Dr. Diona Browner...will obtain EKG now and he will evaluate.  -DVT prophylaxis:  Lovenox  Wendi Maya, PA-C [facility] [phone] 02/05/2021  11:13 AM   VASCULAR STAFF ADDENDUM: I have independently interviewed and examined the patient. I agree with the above.  Patient doing well postop day 1 left common femoral arterectomy. Excellent signals distally, sensorimotor intact Needs to pass with physical therapy Known exposed hardware in the right hip, he is elected to follow-up with [facility] at a later date. Patient with 24 beat run of V. tach, appreciate cardiology assistance. DC pending cardiology recommendations.   Fara Olden, MD [facility]  Office Phone Number: [phone] 02/05/2021 12:24 PM

## 2021-02-05 NOTE — Progress Notes (Signed)
PHARMACIST LIPID MONITORING   Francis Mckenzie is a 75 y.o. male admitted on 02/04/2021 with PAD.  Pharmacy has been consulted to optimize lipid-lowering therapy with the indication of secondary prevention for clinical ASCVD.  Recent Labs:  Lipid Panel (last 6 months):   Lab Results  Component Value Date   CHOL 116 02/05/2021   TRIG 74 02/05/2021   HDL 35 (L) 02/05/2021   CHOLHDL 3.3 02/05/2021   VLDL 15 02/05/2021   LDLCALC 66 02/05/2021    Hepatic function panel (last 6 months):   Lab Results  Component Value Date   AST 15 02/05/2021   ALT 11 02/05/2021   ALKPHOS 71 02/05/2021   BILITOT 1.7 (H) 02/05/2021    SCr (since admission):   Serum creatinine: 0.92 mg/dL 03/00/92 3300 Estimated creatinine clearance: 78.5 mL/min  Current therapy and lipid therapy tolerance Current lipid-lowering therapy: Atorvastatin 10 Previous lipid-lowering therapies (if applicable): none Documented or reported allergies or intolerances to lipid-lowering therapies (if applicable): none  Assessment:   Patient agrees with changes to lipid-lowering therapy  Plan:    1.Statin intensity (high intensity recommended for all patients regardless of the LDL):  Increase atorvastatin to 40 mg daily  2.Add ezetimibe (if any one of the following):   Not indicated at this time.  3.Refer to lipid clinic:   No  4.Follow-up with:  Primary care provider - Eber Jones, NP  5.Follow-up labs after discharge:  Changes in lipid therapy were made. Check a lipid panel in 8-12 weeks then annually.      Thank you for allowing pharmacy to participate in this patient's care.  Enos Fling, PharmD PGY1 Pharmacy Resident 02/05/2021 1:44 PM Check [email or web address] for unit specific pharmacy number

## 2021-02-05 NOTE — Progress Notes (Signed)
V-tach x 24 beats.  Pt resting in bed/asymptomatic. PA paged

## 2021-02-05 NOTE — Consult Note (Signed)
Cardiology Consultation:   Patient ID: Francis Mckenzie; [id number]; 02/18/1946   Admit date: 02/04/2021 Date of Consult: 02/05/2021  Primary Care Provider: Eber Jones, NP Primary Cardiologist: Lesleigh Noe, MD Primary Electrophysiologist: None   Patient Profile:   DERWARD MARPLE is a 75 y.o. male with a history of PAD and regular alcohol use who is being seen today for the evaluation of asymptomatic NSVT at the request of Dr. Randie Heinz.  History of Present Illness:   Mr. Zerby is currently hospitalized and POD #1 status post left common femoral endarterectomy with left greater saphenous vein patch angioplasty, also underwent VBX stent of left common iliac artery and Elluvia stent of left superficial femoral artery.  He was noted to have a burst of NSVT by cardiac monitor, approximately 20 beats, asymptomatic, otherwise has been in sinus rhythm with no substantial degree of ectopy.  He was seen for preprocedure cardiac evaluation by Dr. Katrinka Blazing on September 2 and underwent a Lexiscan Myoview on September 8 which was low risk, no clear evidence of ischemia, LVEF reported at 46% but visually appeared normal.  He was cleared for procedure.  He does have a history of regular alcohol intake on a daily basis.  No prior documented cardiomyopathy, no sudden dizziness or syncope.  Baseline ECG showed sinus rhythm with normal intervals.  Past Medical History:  Diagnosis Date   History of DVT (deep vein thrombosis)    Post-operative   History of MRSA infection    Right hip   Peripheral vascular disease (HCC)     Past Surgical History:  Procedure Laterality Date   HIP SURGERY Right      Inpatient Medications: Scheduled Meds:  atorvastatin  10 mg Oral q1800   clopidogrel  75 mg Oral Q0600   docusate sodium  100 mg Oral Daily   enoxaparin (LOVENOX) injection  30 mg Subcutaneous Q24H   pantoprazole  40 mg Oral Daily   Continuous Infusions:  sodium chloride     magnesium  sulfate bolus IVPB     magnesium sulfate bolus IVPB     PRN Meds: sodium chloride, acetaminophen **OR** acetaminophen, alum & mag hydroxide-simeth, bisacodyl, guaiFENesin-dextromethorphan, HYDROmorphone (DILAUDID) injection, labetalol, magnesium sulfate bolus IVPB, metoprolol tartrate, ondansetron, oxyCODONE-acetaminophen, phenol, potassium chloride, senna-docusate  Allergies:    Allergies  Allergen Reactions   Aspirin Rash    Social History:   Social History   Tobacco Use   Smoking status: Former    Types: Cigarettes   Smokeless tobacco: Never  Substance Use Topics   Alcohol use: Yes    Alcohol/week: 14.0 standard drinks    Types: 14 Cans of beer per week    Comment: 2 beers a day     Family History:   The patient's family history includes Heart attack in his sister.  ROS:  Please see the history of present illness.  Claudication.  History of poorly healing ulcer.  All other ROS reviewed and negative.     Physical Exam/Data:   Vitals:   02/05/21 0200 02/05/21 0400 02/05/21 0800 02/05/21 1244  BP: 137/62 132/69 (!) 156/66 136/88  Pulse:   64 64  Resp: 14 17 18 18   Temp:  98.5 F (36.9 C) 98 F (36.7 C) 98.1 F (36.7 C)  TempSrc:  Oral Oral Oral  SpO2: 97% 94% 100% 100%  Weight:      Height:        Intake/Output Summary (Last 24 hours) at 02/05/2021 1310 Last data filed  at 02/05/2021 1245 Gross per 24 hour  Intake 2368.08 ml  Output 2700 ml  Net -331.92 ml   Filed Weights   02/04/21 0723  Weight: 88.5 kg   Body mass index is 26.45 kg/m.   Gen: Patient appears comfortable at rest. HEENT: Conjunctiva and lids normal, oropharynx clear. Neck: Supple, no elevated JVP or carotid bruits, no thyromegaly. Lungs: Clear to auscultation, nonlabored breathing at rest. Cardiac: Regular rate and rhythm, no S3 or significant systolic murmur, no pericardial rub. Abdomen: Soft, nontender, bowel sounds present. Extremities: Left groin/thigh incisions healing, no  hematoma, feet are warm.. Skin: Warm and dry. Musculoskeletal: No kyphosis. Neuropsychiatric: Alert and oriented x3, affect grossly appropriate.  EKG:  An ECG dated 01/21/2021 was personally reviewed today and demonstrated:  Sinus rhythm.  Telemetry:  I personally reviewed telemetry which shows sinus rhythm at present.  Relevant CV Studies:  Lexiscan Myoview 01/27/2021:   The study is normal. The study is low risk.   No ST deviation was noted.   LV perfusion is normal. There is no evidence of ischemia. There is no evidence of infarction.   Left ventricular function is normal. Nuclear stress EF: 46 %. The left ventricular ejection fraction is mildly decreased (45-54%). End diastolic cavity size is normal.   Reduced counts in the inferior segments with normal wall motion consistent with diaphragm attenuation.  No evidence of ischemia or infarction. LV function at lower limits of normal for this modality, 46%.  Visually appears normal. This is a low risk study.  Laboratory Data:  Chemistry Recent Labs  Lab 02/01/21 1100 02/04/21 1657 02/05/21 0151 02/05/21 1150  NA 134*  --  134* 138  K 3.8  --  4.1 3.7  CL 100  --  103 107  CO2 24  --  21* 23  GLUCOSE 94  --  147* 101*  BUN 9  --  8 8  CREATININE 0.88 0.94 1.00 0.92  CALCIUM 8.9  --  8.1* 8.6*  GFRNONAA >60 >60 >60 >60  ANIONGAP 10  --  10 8    Recent Labs  Lab 02/01/21 1100 02/05/21 1150  PROT 7.1 6.3*  ALBUMIN 3.8 3.4*  AST 16 15  ALT 16 11  ALKPHOS 89 71  BILITOT 1.7* 1.7*   Hematology Recent Labs  Lab 02/01/21 1100 02/04/21 1657 02/05/21 0151  WBC 6.9 10.2 10.2  RBC 5.30 5.15 4.22  HGB 16.1 15.8 12.9*  HCT 49.2 48.0 39.5  MCV 92.8 93.2 93.6  MCH 30.4 30.7 30.6  MCHC 32.7 32.9 32.7  RDW 12.8 12.9 13.0  PLT 229 187 186    Radiology/Studies:  DG C-Arm 1-60 Min-No Report  Result Date: 02/04/2021 CLINICAL DATA:  Peripheral arterial disease EXAM: LEFT FEMUR 2 VIEWS; DG C-ARM 1-60 MIN-NO REPORT  COMPARISON:  CTA 01/17/2021 FINDINGS: Submitted a series of intra procedural fluoroscopic images documenting left lower extremity arteriography. A distal SFA and proximal popliteal occlusion is identified, with subsequent stenting. IMPRESSION: Fluoroscopy provided for distal SFA stenting. Electronically Signed   By: Corlis Leak M.D.   On: 02/04/2021 13:04   DG FEMUR MIN 2 VIEWS LEFT  Result Date: 02/04/2021 CLINICAL DATA:  Peripheral arterial disease EXAM: LEFT FEMUR 2 VIEWS; DG C-ARM 1-60 MIN-NO REPORT COMPARISON:  CTA 01/17/2021 FINDINGS: Submitted a series of intra procedural fluoroscopic images documenting left lower extremity arteriography. A distal SFA and proximal popliteal occlusion is identified, with subsequent stenting. IMPRESSION: Fluoroscopy provided for distal SFA stenting. Electronically Signed   By:  Corlis Leak M.D.   On: 02/04/2021 13:04   DG FEMUR PORT, MIN 2 VIEWS RIGHT  Result Date: 02/04/2021 CLINICAL DATA:  Exposed hardware EXAM: RIGHT FEMUR PORTABLE 2 VIEW COMPARISON:  None. FINDINGS: Surgical changes of prior ORIF of a remote proximal femoral fracture. One of the cannulated lag screws has backed out and protrudes through the skin surface. The proximal femur is diffusely abnormal in appearance with complete ankylosis of the hip joint. Additionally, the femoral neck appears high riding. 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 Heterotopic ossification is also present around the tube partially backed out cannulated lag screws. Atherosclerotic calcifications visualized throughout the leg. Probable small suprapatellar knee joint effusion. IMPRESSION: Surgical changes of prior ORIF of a remote proximal femoral fracture with 2 cannulated lag screws which have backed out, 1 of which protrudes through the skin surface. Abnormal appearance of the proximal femur with complete ankylosis of the hip joint. Query clinical history of developmental dysplasia of the hip? Small knee  joint effusion. Electronically Signed   By: Malachy Moan M.D.   On: 02/04/2021 14:54   HYBRID OR IMAGING ([facility] ONLY)  Result Date: 02/04/2021 There is no interpretation for this exam.  This order is for images obtained during a surgical procedure.  Please See "Surgeries" Tab for more information regarding the procedure.    Assessment and Plan:   1.  Asymptomatic NSVT, baseline ECG shows normal sinus rhythm with normal intervals and recent ischemic work-up reassuring.  He is status post peripheral vascular surgery.  No chest pain reported.  2.  PAD status post post left common femoral endarterectomy with left greater saphenous vein patch angioplasty, also underwent VBX stent of left common iliac artery and Elluvia stent of left superficial femoral artery.  He is currently on Plavix and Lipitor.  3.  Regular alcohol intake.  Likely needs withdrawal precautions.  Follow-up ECG reviewed today and is normal.  He was given a dose of IV magnesium by primary team, potassium is normal.  Would check a BMET and magnesium in the morning.  Echocardiogram will be obtained to ensure normal LVEF (reported at 46% by Myoview but visually appeared normal).  Suggest withdrawal precautions.  No chest pain or breathlessness to suggest active ischemic symptoms and recent work-up was reassuring.  We will continue to follow.  Signed, Nona Dell, MD  02/05/2021 1:10 PM

## 2021-02-05 NOTE — Evaluation (Signed)
Physical Therapy Evaluation Patient Details Name: Francis Mckenzie MRN: [id number] DOB: 1945/05/24 Today's Date: 02/05/2021  History of Present Illness  The pt is a 75 yo male presenting 9/16 for L CFA endarterectomy with vein patch angioplasty and stenting of L common iliac artery and L SFA due to LLE ischemia. PMH includes: tobacco use, IVC placement, R hip surgery.   Clinical Impression  Pt in bed upon arrival of PT, agreeable to evaluation at this time. 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 Reports otherwise ully independent at baseline. The pt now presents with limitations in functional mobility, dynamic stability, and endurance due to above dx, and will continue to benefit from skilled PT to address these deficits. The pt was able to complete sit-stand transfers and hallway ambulation with minG for safety and use of SPC. Although abnormal, suspect pt's learned gait pattern is his baseline due to deficits in R hip ROM and baseline leg length discrepancy. Pt with no overt LOB or need for physical assist with gait, but minG for safety with ambulation and stairs. Will benefit from continued therapies to facilitate improved dynamic stability and reduced risk of falls with ambulation in the home and on unstable surfaces following d/c.         Recommendations for follow up therapy are one component of a multi-disciplinary discharge planning process, led by the attending physician.  Recommendations may be updated based on patient status, additional functional criteria and insurance authorization.  Follow Up Recommendations Home health PT;Supervision for mobility/OOB    Equipment Recommendations  None recommended by PT    Recommendations for Other Services       Precautions / Restrictions Precautions Precautions: Fall Precaution Comments: R hip with very limited ROM, limb length discrepancy with height adjusting shoe in room, 24 beat run  vtach in AM of 9/17 Restrictions Weight Bearing Restrictions: No      Mobility  Bed Mobility Overal bed mobility: Modified Independent             General bed mobility comments: slightly increased time, able to complete without assist    Transfers Overall transfer level: Modified independent Equipment used: None             General transfer comment: increased time and effort, pt moving into sidelying at EOB due to limitation in R hip mobility, stands from semi-lunge position, seems to be pt baseline  Ambulation/Gait Ambulation/Gait assistance: Min guard Gait Distance (Feet): 400 Feet Assistive device: Straight cane Gait Pattern/deviations: Step-through pattern;Decreased dorsiflexion - right;Decreased weight shift to right Gait velocity: decreased   General Gait Details: pt with leg length discrepancy at baseline, still walks on tip toes despite donning of shoe with heel lift. no overt LOB, but with significant compensatory strategies due to limited R hip ROM and RLE shorter than LLE. HR stable with ambulation, pt gait likely baseline  Stairs Stairs: Yes Stairs assistance: Min guard Stair Management: One rail Left;Step to pattern;With cane Number of Stairs: 1 (x3) General stair comments: pt with exaggerated L hip flexion and circling to bring L foot to step, then able to push up with good power         Balance Overall balance assessment: Mild deficits observed, not formally tested  Pertinent Vitals/Pain Pain Assessment: Faces Faces Pain Scale: Hurts a little bit Pain Location: L incision tightness with hip flexion Pain Descriptors / Indicators: Tightness Pain Intervention(s): Limited activity within patient's tolerance;Repositioned;Monitored during session    Home Living Family/patient expects to be discharged to:: Private residence Living Arrangements: Children (one daughter with him, one across  the street) Available Help at Discharge: Family;Available PRN/intermittently Type of Home: House Home Access: Stairs to enter Entrance Stairs-Rails: Left Entrance Stairs-Number of Steps: 4 Home Layout: One level Home Equipment: Cane - single point;Bedside commode;Grab bars - tub/shower Additional Comments: pt using primarily cane at home, reports walking outside without issue    Prior Function Level of Independence: Independent with assistive device(s)         Comments: independent with use of cane     Hand Dominance   Dominant Hand: Right    Extremity/Trunk Assessment   Upper Extremity Assessment Upper Extremity Assessment: Defer to OT evaluation    Lower Extremity Assessment Lower Extremity Assessment: RLE deficits/detail RLE Deficits / Details: pt with R hip limited to 10-20 deg hip flexion. reports this is due to ORIF 6-7 years ago RLE Sensation: WNL    Cervical / Trunk Assessment Cervical / Trunk Assessment: Normal  Communication   Communication: No difficulties  Cognition Arousal/Alertness: Awake/alert Behavior During Therapy: WFL for tasks assessed/performed Overall Cognitive Status: Within Functional Limits for tasks assessed                                        General Comments General comments (skin integrity, edema, etc.): VSS on RA, no vtach during mobility, no SOB    Exercises     Assessment/Plan    PT Assessment Patient needs continued PT services  PT Problem List Decreased range of motion;Decreased activity tolerance;Decreased strength;Decreased balance;Decreased mobility;Decreased safety awareness       PT Treatment Interventions DME instruction;Gait training;Stair training;Functional mobility training;Therapeutic exercise;Therapeutic activities;Balance training;Patient/family education    PT Goals (Current goals can be found in the Care Plan section)  Acute Rehab PT Goals Patient Stated Goal: return to walking  outside PT Goal Formulation: With patient Time For Goal Achievement: 02/19/21 Potential to Achieve Goals: Good    Frequency Min 3X/week    AM-PAC PT "6 Clicks" Mobility  Outcome Measure Help needed turning from your back to your side while in a flat bed without using bedrails?: None Help needed moving from lying on your back to sitting on the side of a flat bed without using bedrails?: None Help needed moving to and from a bed to a chair (including a wheelchair)?: A Little Help needed standing up from a chair using your arms (e.g., wheelchair or bedside chair)?: A Little Help needed to walk in hospital room?: A Little Help needed climbing 3-5 steps with a railing? : A Little 6 Click Score: 20    End of Session Equipment Utilized During Treatment: Gait belt Activity Tolerance: Patient tolerated treatment well Patient left: in chair;with call bell/phone within reach Nurse Communication: Mobility status PT Visit Diagnosis: Other abnormalities of gait and mobility (R26.89)    Time: 1100-1126 PT Time Calculation (min) (ACUTE ONLY): 26 min   Charges:   PT Evaluation $PT Eval Moderate Complexity: 1 Mod PT Treatments $Gait Training: 8-22 mins        Vickki Muff, PT, DPT   Acute Rehabilitation Department Pager #: [phone]  Catarina Huntley  [place] 02/05/2021, 1:11 PM

## 2021-02-05 NOTE — Progress Notes (Signed)
Mobility Specialist: Progress Note   02/05/21 1219  Mobility  Activity Ambulated in hall  Level of Assistance Contact guard assist, steadying assist  Assistive Device Cane  Distance Ambulated (ft) 470 ft (3 stairs)  Mobility Ambulated with assistance in hallway  Mobility Response Tolerated well  Mobility performed by Mobility specialist;Other (comment) (PT Katie)  Bed Position Chair  $Mobility charge 1 Mobility   Pre-Mobility: 67 HR, 170/83 BP, 97% SpO2 Post-Mobility: 75 HR, 161/77 BP, 98% SpO2  Pt unable to bend R hip requiring him to stand by turning onto his L side and standing using L leg only while keeping R leg extended. Screw in R hip visible from fusion, PT present in the room. Pt has pair of shoes needed for ambulation d/t pt's R leg being shorter than his L. Even while wearing shoes during ambulation pt presents with antalgic gait. Pt had no c/o during ambulation. Pt to recliner after walk with RN present in the room.   [facility] Ayelen Sciortino Mobility Specialist Mobility Specialist Phone: [phone]

## 2021-02-06 ENCOUNTER — Inpatient Hospital Stay (HOSPITAL_COMMUNITY): Payer: Medicare Other

## 2021-02-06 DIAGNOSIS — I472 Ventricular tachycardia: Secondary | ICD-10-CM

## 2021-02-06 LAB — ECHOCARDIOGRAM COMPLETE
AR max vel: 2.57 cm2
AV Area VTI: 2.37 cm2
AV Area mean vel: 2.3 cm2
AV Mean grad: 3 mmHg
AV Peak grad: 4.9 mmHg
Ao pk vel: 1.11 m/s
Area-P 1/2: 3.21 cm2
Height: 72 in
S' Lateral: 4 cm
Weight: 3120 [oz_av]

## 2021-02-06 LAB — BASIC METABOLIC PANEL
Anion gap: 10 (ref 5–15)
BUN: 11 mg/dL (ref 8–23)
CO2: 24 mmol/L (ref 22–32)
Calcium: 8.2 mg/dL — ABNORMAL LOW (ref 8.9–10.3)
Chloride: 105 mmol/L (ref 98–111)
Creatinine, Ser: 0.88 mg/dL (ref 0.61–1.24)
GFR, Estimated: >60 mL/min (ref >60)
Glucose, Bld: 106 mg/dL — ABNORMAL HIGH (ref 70–99)
Potassium: 3.7 mmol/L (ref 3.5–5.1)
Sodium: 139 mmol/L (ref 135–145)

## 2021-02-06 LAB — CBC
HCT: 41.9 % (ref 39.0–52.0)
Hemoglobin: 13.6 g/dL (ref 13.0–17.0)
MCH: 30.2 pg (ref 26.0–34.0)
MCHC: 32.5 g/dL (ref 30.0–36.0)
MCV: 93.1 fL (ref 80.0–100.0)
Platelets: 173 10*3/uL (ref 150–400)
RBC: 4.5 MIL/uL (ref 4.22–5.81)
RDW: 13.2 % (ref 11.5–15.5)
WBC: 8.6 10*3/uL (ref 4.0–10.5)
nRBC: 0 % (ref 0.0–0.2)

## 2021-02-06 LAB — MAGNESIUM: Magnesium: 2.1 mg/dL (ref 1.7–2.4)

## 2021-02-06 MED ORDER — ATORVASTATIN CALCIUM 40 MG PO TABS: 40.0000 mg | ORAL_TABLET | Freq: Every day | ORAL | 3 refills | Status: AC

## 2021-02-06 MED ORDER — CLOPIDOGREL BISULFATE 75 MG PO TABS: 75.0000 mg | ORAL_TABLET | Freq: Every day | ORAL | 3 refills | Status: DC

## 2021-02-06 NOTE — Progress Notes (Signed)
   Progress Note  Patient Name: Francis Mckenzie Date of Encounter: 02/06/2021  Primary Cardiologist: Lesleigh Noe, MD  Chart reviewed since consultation yesterday.  Telemetry shows no further NSVT.  Patient up ambulating with PT in the hall, no specific cardiac symptoms.  He is afebrile, heart rate is in the 70s to 80s in sinus rhythm by telemetry.  Systolic blood pressure 160s to 170s.  Pertinent lab work includes potassium 3.7, magnesium 2.1, creatinine 0.88, hemoglobin 13.6, LDL 66.  ECG today shows normal sinus rhythm with normal intervals.  Echocardiogram is pending.  Inpatient Medications    Scheduled Meds:  atorvastatin  40 mg Oral q1800   clopidogrel  75 mg Oral Q0600   docusate sodium  100 mg Oral Daily   enoxaparin (LOVENOX) injection  30 mg Subcutaneous Q24H   pantoprazole  40 mg Oral Daily   Continuous Infusions:  sodium chloride     magnesium sulfate bolus IVPB     Follow-up echocardiogram, if LVEF normal no further cardiac work-up is suggested at this point.  It does look like he needs treatment for hypertension, could consider addition of ARB but ultimately needs follow-up with PCP.  Signed, Nona Dell, MD  02/06/2021, 10:49 AM

## 2021-02-06 NOTE — Progress Notes (Signed)
Mobility Specialist: Progress Note   02/06/21 1056  Mobility  Activity Ambulated in hall  Level of Assistance Contact guard assist, steadying assist  Assistive Device Cane  Distance Ambulated (ft) 470 ft  Mobility Ambulated with assistance in hallway  Mobility Response Tolerated well  Mobility performed by Mobility specialist  Bed Position Chair  $Mobility charge 1 Mobility   Pre-Mobility: 80 HR Post-Mobility: 75 HR  Pt required assistance donning shoes, otherwise contact guard to stand as well as during ambulation. Pt asx throughout. Pt to the recliner after walk with call bell and phone at his side.   [facility] Emiliya Chretien Mobility Specialist Mobility Specialist Phone: [phone]

## 2021-02-06 NOTE — TOC Transition Note (Signed)
Transition of Care [facility]) - CM/SW Discharge Note   Patient Details  Name: Francis Mckenzie MRN: [id number] Date of Birth: May 10, 1946  Transition of Care [facility]) CM/SW Contact:  Norvel Richards, RN Phone Number: 02/06/2021, 5:12 PM   Clinical Narrative:  [facility] team for discharge planning. Spoke to daughter- Marylu Lund who shares she was already contacted by an agency (she doesn't remember the name) and the visit is scheduled for tomorrow. I advised that she call the PCP if the vendor does not show. She verbalized understanding.    Final next level of care: Home w Home Health Services Barriers to Discharge: No Barriers Identified   Patient Goals and CMS Choice        Discharge Placement                       Discharge Plan and Services                                     Social Determinants of Health (SDOH) Interventions     Readmission Risk Interventions No flowsheet data found.

## 2021-02-06 NOTE — Progress Notes (Signed)
Echo completed. Normal LVEF hterfore no further work-up per cardiology. Discharge home. Follow-up with PCP within 2 weeks to review hypertension. 1. Left ventricular ejection fraction, by estimation, is 60 to 65%. The  left ventricle has normal function. Left ventricular endocardial border  not optimally defined to evaluate regional wall motion. Left ventricular  diastolic parameters were normal.   2. Right ventricular systolic function is normal. The right ventricular  size is normal. Tricuspid regurgitation signal is inadequate for assessing  PA pressure.   3. The mitral valve is grossly normal. No evidence of mitral valve  regurgitation. No evidence of mitral stenosis.   4. The aortic valve was not well visualized. Aortic valve regurgitation  is not visualized. No aortic stenosis is present.   5. The inferior vena cava is normal in size with greater than 50%  respiratory variability, suggesting right atrial pressure of 3 mmHg.   FINDINGS   Left Ventricle: Left ventricular ejection fraction, by estimation, is 60  to 65%. The left ventricle has normal function. Left ventricular  endocardial border not optimally defined to evaluate regional wall motion.  The left ventricular internal cavity  size was normal in size. There is no left ventricular hypertrophy. Left  ventricular diastolic parameters were normal.   Right Ventricle: The right ventricular size is normal. No increase in  right ventricular wall thickness. Right ventricular systolic function is  normal. Tricuspid regurgitation signal is inadequate for assessing PA  pressure.   Left Atrium: Left atrial size was normal in size.   Right Atrium: Right atrial size was normal in size.   Pericardium: Trivial pericardial effusion is present. Presence of  pericardial fat pad.   Mitral Valve: The mitral valve is grossly normal. No evidence of mitral  valve regurgitation. No evidence of mitral valve stenosis.   Tricuspid Valve: The  tricuspid valve is grossly normal. Tricuspid valve  regurgitation is not demonstrated. No evidence of tricuspid stenosis.   Aortic Valve: The aortic valve was not well visualized. Aortic valve  regurgitation is not visualized. No aortic stenosis is present. Aortic  valve mean gradient measures 3.0 mmHg. Aortic valve peak gradient measures  4.9 mmHg. Aortic valve area, by VTI  measures 2.37 cm.   Pulmonic Valve: The pulmonic valve was not well visualized. Pulmonic valve  regurgitation is not visualized.   Aorta: The aortic root was not well visualized.   Venous: The inferior vena cava is normal in size with greater than 50%  respiratory variability, suggesting right atrial pressure of 3 mmHg.   IAS/Shunts: The atrial septum is grossly normal.      LEFT VENTRICLE  PLAX 2D  LVIDd:         5.40 cm  Diastology  LVIDs:         4.00 cm  LV e' medial:    8.38 cm/s  LV PW:         0.80 cm  LV E/e' medial:  7.9  LV IVS:        1.00 cm  LV e' lateral:   11.20 cm/s  LVOT diam:     2.00 cm  LV E/e' lateral: 5.9  LV SV:         60  LV SV Index:   28  LVOT Area:     3.14 cm      RIGHT VENTRICLE  RV Basal diam:  3.30 cm   LEFT ATRIUM           Index  RIGHT ATRIUM           Index  LA diam:      4.00 cm 1.90 cm/m  RA Area:     19.20 cm  LA Vol (A2C): 45.7 ml 21.68 ml/m RA Volume:   55.00 ml  26.09 ml/m  LA Vol (A4C): 44.0 ml 20.88 ml/m   AORTIC VALVE  AV Area (Vmax):    2.57 cm  AV Area (Vmean):   2.30 cm  AV Area (VTI):     2.37 cm  AV Vmax:           111.00 cm/s  AV Vmean:          75.200 cm/s  AV VTI:            0.253 m  AV Peak Grad:      4.9 mmHg  AV Mean Grad:      3.0 mmHg  LVOT Vmax:         90.70 cm/s  LVOT Vmean:        55.000 cm/s  LVOT VTI:          0.191 m  LVOT/AV VTI ratio: 0.75     AORTA  Ao Root diam: 3.60 cm   MITRAL VALVE  MV Area (PHT): 3.21 cm    SHUNTS  MV Decel Time: 236 msec    Systemic VTI:  0.19 m  MV E velocity: 66.00 cm/s  Systemic  Diam: 2.00 cm  MV A velocity: 88.00 cm/s  MV E/A ratio:  0.75   Lennie Odor MD  Electronically signed by Lennie Odor MD  Signature Date/Time: 02/06/2021/4:22:35 PM         Final

## 2021-02-06 NOTE — Evaluation (Signed)
Occupational Therapy Evaluation Patient Details Name: Francis Mckenzie MRN: [id number] DOB: 06-20-1945 Today's Date: 02/06/2021   History of Present Illness The pt is a 75 yo male presenting 9/16 for L CFA endarterectomy with vein patch angioplasty and stenting of L common iliac artery and L SFA due to LLE ischemia. PMH includes: tobacco use, IVC placement, R hip surgery.   Clinical Impression   Pt admitted for concerns listed above. 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 Pt uses a cane at baseline and adaptive equipment, such as a reacher to assist with completing ADL's. At this time, pt continues to demonstrate independence with all ADL's and functional mobility.  Pt has no concerns at this time and does not need OT services, Acute OT will sign off.      Recommendations for follow up therapy are one component of a multi-disciplinary discharge planning process, led by the attending physician.  Recommendations may be updated based on patient status, additional functional criteria and insurance authorization.   Follow Up Recommendations  No OT follow up    Equipment Recommendations  None recommended by OT    Recommendations for Other Services       Precautions / Restrictions Precautions Precautions: Fall Precaution Comments: R hip with very limited ROM, limb length discrepancy with height adjusting shoe in room, 24 beat run vtach in AM of 9/17 Restrictions Weight Bearing Restrictions: No      Mobility Bed Mobility Overal bed mobility: Modified Independent             General bed mobility comments: slightly increased time, able to complete without assist    Transfers Overall transfer level: Modified independent Equipment used: None             General transfer comment: increased time and effort, pt moving into sidelying at EOB due to limitation in R hip mobility, stands from semi-lunge position, seems to be pt  baseline    Balance Overall balance assessment: Mild deficits observed, not formally tested                                         ADL either performed or assessed with clinical judgement   ADL Overall ADL's : At baseline;Modified independent                                       General ADL Comments: Pt has his own compensatory strategies to complete ADL's due to his hip joint being locked in place. Family assists with socks and shoes, he uses a reacher for underwear and pants.     Vision Baseline Vision/History: 1 Wears glasses Ability to See in Adequate Light: 0 Adequate Patient Visual Report: No change from baseline Vision Assessment?: No apparent visual deficits     Perception Perception Perception Tested?: No   Praxis Praxis Praxis tested?: Not tested    Pertinent Vitals/Pain Pain Assessment: No/denies pain     Hand Dominance Right   Extremity/Trunk Assessment Upper Extremity Assessment Upper Extremity Assessment: Overall WFL for tasks assessed   Lower Extremity Assessment Lower Extremity Assessment: Defer to PT evaluation   Cervical / Trunk Assessment Cervical / Trunk Assessment: Normal   Communication Communication Communication: No difficulties   Cognition Arousal/Alertness: Awake/alert Behavior During  Therapy: WFL for tasks assessed/performed Overall Cognitive Status: Within Functional Limits for tasks assessed                                     General Comments  VSS on RA    Exercises     Shoulder Instructions      Home Living Family/patient expects to be discharged to:: Private residence Living Arrangements: Children Available Help at Discharge: Family;Available PRN/intermittently Type of Home: House Home Access: Stairs to enter [employer] of Steps: 4 Entrance Stairs-Rails: Left Home Layout: One level     Bathroom Shower/Tub: Chief Strategy Officer:  Standard Bathroom Accessibility: Yes How Accessible: Accessible via walker Home Equipment: Cane - single point;Bedside commode;Grab bars - tub/shower   Additional Comments: pt using primarily cane at home, reports walking outside without issue      Prior Functioning/Environment Level of Independence: Independent with assistive device(s)        Comments: independent with use of cane        OT Problem List: Decreased activity tolerance;Cardiopulmonary status limiting activity      OT Treatment/Interventions:      OT Goals(Current goals can be found in the care plan section) Acute Rehab OT Goals Patient Stated Goal: return to walking outside OT Goal Formulation: All assessment and education complete, DC therapy Time For Goal Achievement: 02/06/21 Potential to Achieve Goals: Good  OT Frequency:     Barriers to D/C:            Co-evaluation              AM-PAC OT "6 Clicks" Daily Activity     Outcome Measure Help from another person eating meals?: None Help from another person taking care of personal grooming?: None Help from another person toileting, which includes using toliet, bedpan, or urinal?: None Help from another person bathing (including washing, rinsing, drying)?: None Help from another person to put on and taking off regular upper body clothing?: None Help from another person to put on and taking off regular lower body clothing?: A Little 6 Click Score: 23   End of Session Nurse Communication: Mobility status  Activity Tolerance: Patient tolerated treatment well Patient left: in bed;with call bell/phone within reach  OT Visit Diagnosis: Unsteadiness on feet (R26.81);Other abnormalities of gait and mobility (R26.89)                Time: 1517-1530 OT Time Calculation (min): 13 min Charges:  OT General Charges $OT Visit: 1 Visit OT Evaluation $OT Eval Low Complexity: 1 Low  Demya Scruggs H., OTR/L Acute Rehabilitation  Merrily Tegeler Elane Nora Sabey 02/06/2021,  4:22 PM

## 2021-02-06 NOTE — Progress Notes (Signed)
  Echocardiogram 2D Echocardiogram has been performed.  Francis Mckenzie F 02/06/2021, 2:05 PM

## 2021-02-06 NOTE — Discharge Instructions (Signed)
 [facility] [place]  Discharge instructions  Lower Extremity Bypass Surgery  Please refer to the following instruction for your post-procedure care. Your surgeon or physician assistant will discuss any changes with you.  Activity  You are encouraged to walk as much as you can. You can slowly return to normal activities during the month after your surgery. Avoid strenuous activity and heavy lifting until your doctor tells you it's OK. Avoid activities such as vacuuming or swinging a golf club. Do not drive until your doctor give the OK and you are no longer taking prescription pain medications. It is also normal to have difficulty with sleep habits, eating and bowel movement after surgery. These will go away with time.  Bathing/Showering  You may shower after you go home. Do not soak in a bathtub, hot tub, or swim until the incision heals completely.  Incision Care  Clean your incision with mild soap and water. Shower every day. Pat the area dry with a clean towel. You do not need a bandage unless otherwise instructed. Do not apply any ointments or creams to your incision. If you have open wounds you will be instructed how to care for them or a visiting nurse may be arranged for you. If you have staples or sutures along your incision they will be removed at your post-op appointment. You may have skin glue on your incision. Do not peel it off. It will come off on its own in about one week. If you have a great deal of moisture in your groin, use a gauze help keep this area dry.  Diet  Resume your normal diet. There are no special food restrictions following this procedure. A low fat/ low cholesterol diet is recommended for all patients with vascular disease. In order to heal from your surgery, it is CRITICAL to get adequate nutrition. Your body requires vitamins, minerals, and protein. Vegetables are the best source of vitamins and minerals. Vegetables also provide the  perfect balance of protein. Processed food has little nutritional value, so try to avoid this.  Medications  Resume taking all your medications unless your doctor or nurse practitioner tells you not to. If your incision is causing pain, you may take over-the-counter pain relievers such as acetaminophen (Tylenol). If you were prescribed a stronger pain medication, please aware these medication can cause nausea and constipation. Prevent nausea by taking the medication with a snack or meal. Avoid constipation by drinking plenty of fluids and eating foods with high amount of fiber, such as fruits, vegetables, and grains. Take Colase 100 mg (an over-the-counter stool softener) twice a day as needed for constipation. Do not take Tylenol if you are taking prescription pain medications.  Follow Up  Our office will schedule a follow up appointment 2-3 weeks following discharge.  Please call us immediately for any of the following conditions  Severe or worsening pain in your legs or feet while at rest or while walking Increase pain, redness, warmth, or drainage (pus) from your incision site(s) Fever of 101 degree or higher The swelling in your leg with the bypass suddenly worsens and becomes more painful than when you were in the hospital If you have been instructed to feel your graft pulse then you should do so every day. If you can no longer feel this pulse, call the office immediately. Not all patients are given this instruction.  Leg swelling is common after leg bypass surgery.  The swelling should improve over a few months   following surgery. To improve the swelling, you may elevate your legs above the level of your heart while you are sitting or resting. Your surgeon or physician assistant may ask you to apply an ACE wrap or wear compression (TED) stockings to help to reduce swelling.  Reduce your risk of vascular disease  Stop smoking. If you would like help call [facility] at 1-800-QUIT-NOW  (1-800-784-8669) or [facility] at 336-586-4000.  Manage your cholesterol Maintain a desired weight Control your diabetes weight Control your diabetes Keep your blood pressure down  If you have any questions, please call the office at [phone]   

## 2021-02-06 NOTE — Progress Notes (Addendum)
   VASCULAR SURGERY ASSESSMENT & PLAN:   PAD:  POD 2 left common femoral endarterectomy with left greater saphenous vein patch angioplasty.  He also underwent VBX stent of left common iliac artery and Elluvia stent of left superficial femoral artery.   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  He is ambulating without difficulty.  He has intact motor function and normal Doppler signals bilaterally. Continue statin and Plavix.   RN noted 24 beat ventricular tachycardia without symptoms yesterday. Cardiology consulted. Normal ECG. 'lytes ok. Echo and repeat labs are pending  Elevated BP: prn meds for now. Will need to follow-up with PCP.  -DVT prophylaxis:  Lovenox  VASCULAR STAFF ADDENDUM: I have independently interviewed and examined the patient. I agree with the above.  DC pending echocardiogram results Will defer hypertension management to PCP  J. Gillis Santa, MD [facility] Phone Number: [phone] 02/06/2021 1:17 PM      SUBJECTIVE:   Denies CP, SOB or lower extremity pain.  PHYSICAL EXAM:   Vitals:   02/05/21 1952 02/05/21 2342 02/06/21 0424 02/06/21 0829  BP: (!) 158/84 (!) 162/83 (!) 175/87 (!) 172/87  Pulse: 74 72 69 73  Resp: 18 20 15 19   Temp: 97.7 F (36.5 C) 98.2 F (36.8 C) 97.7 F (36.5 C) 97.9 F (36.6 C)  TempSrc: Oral Oral Oral Oral  SpO2: 98% 96% 98% 100%  Weight:      Height:       General appearance: Awake, alert in no apparent distress Cardiac: Heart rate and rhythm are regular Respirations: Nonlabored Incisions: Left groin, thigh incisions are all well approximated without bleeding or hematoma Extremities: Both feet are warm with intact sensation and motor function.   Pulse/Doppler exam:  Brisk dorsalis pedis, posterior tibial artery Doppler signals     LABS:   Lab Results  Component Value Date   WBC 8.6 02/06/2021   HGB 13.6  02/06/2021   HCT 41.9 02/06/2021   MCV 93.1 02/06/2021   PLT 173 02/06/2021   Lab Results  Component Value Date   CREATININE 0.88 02/06/2021   Lab Results  Component Value Date   INR 0.9 02/01/2021   CBG (last 3)  No results for input(s): GLUCAP in the last 72 hours.  PROBLEM LIST:    Active Problems:   PAD (peripheral artery disease) (HCC)   CURRENT MEDS:    atorvastatin  40 mg Oral q1800   clopidogrel  75 mg Oral Q0600   docusate sodium  100 mg Oral Daily   enoxaparin (LOVENOX) injection  30 mg Subcutaneous Q24H   pantoprazole  40 mg Oral Daily    02/03/2021, Milinda Antis  Office: [phone] 02/06/2021

## 2021-02-06 NOTE — Progress Notes (Signed)
Discharge instructions provided to patient. All medications, follow up appointments, and discharge instructions provided. IV out. Monitor off CCMD notified. Discharging to home with family.  Kyarah Enamorado R Ladaija Dimino, RN  

## 2021-02-07 ENCOUNTER — Encounter (HOSPITAL_COMMUNITY): Payer: Self-pay | Admitting: Vascular Surgery

## 2021-02-07 NOTE — Discharge Summary (Signed)
Discharge Summary     ANEES VANECEK 1945/08/22 75 y.o. male  [id number]  Admission Date: 02/04/2021  Discharge Date: 02/06/2021 Physician: Dr. Randie Heinz  Admission Diagnosis: PAD (peripheral artery disease) (HCC) [I73.9] right hip ORIF with protruding hardware  NSVT Hypertension  This is a 75 y.o. 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  Findings: The common femoral artery itself was soft with significant posterior plaque approximately 50% of the lumen.  After endarterectomy there was good backbleeding from the SFA and the profunda and the inflow was improved as we extend the endarterectomy up into the external iliac artery.  The common iliac artery on the left was stenosed for approximately 3 cm to a total of 70%.  After stenting was reduced to 0%.  The left SFA was initially patent down to the level of the abductor where it occluded for approximately 10 cm.  After stenting with drug-eluting stent this was reduced to 0%.  Runoff is via all 3 vessels however dominant appears to be the posterior tibial artery.  The pt tolerated the procedure well and was transported to the PACU in excellent condition.   Consults: orthopedic surgery, cardiology  By POD 1, his vital signs were stable and he was afebrile.  His pain was controlled.  He had brisk Doppler signals of the left foot and his incisions were well approximated.  He was noted on telemetry and approximately 24 beat run of ventricular tachycardia.  He was without shortness of breath, palpitations or chest pain.  Electrolyte panel was obtained and the patient was  given 2 g magnesium IV.  Twelve-lead EKG was obtained and cardiology consult requested.  Dr. Diona Browner evaluated the patient and arranged echocardiogram.  Patient was noted to have protruding hardware from previous right hip ORIF.  An orthopedic surgery consultation was obtained.  The patient was directed and desired follow-up at [facility] trauma for evaluation and removal.  On POD 2, he remained stable without further anemia.  Foot was well-perfused.  Echocardiogram was performed with estimated ejection fraction of 60 to 65%.  Follow-up labs were stable.  The patient was stable for discharge home with home health PT.  The patient was noted to have elevated blood pressure readings during his hospitalization.  Recommend follow-up with primary care physician in the next 2 weeks to discuss treatment.  The remainder of the hospital course consisted of increasing mobilization and increasing intake of solids without difficulty.  CBC    Component Value Date/Time   WBC 8.6 02/06/2021 0113   RBC 4.50 02/06/2021 0113   HGB 13.6 02/06/2021 0113   HCT 41.9 02/06/2021 0113   PLT 173 02/06/2021 0113   MCV 93.1 02/06/2021 0113   MCH 30.2 02/06/2021 0113   MCHC 32.5 02/06/2021 0113   RDW 13.2 02/06/2021 0113    BMET    Component Value Date/Time   NA 139 02/06/2021 0113   K 3.7 02/06/2021 0113   CL 105 02/06/2021 0113   CO2 24 02/06/2021 0113   GLUCOSE 106 (H) 02/06/2021 0113   BUN 11 02/06/2021 0113   CREATININE 0.88 02/06/2021 0113   CALCIUM 8.2 (L) 02/06/2021 0113  GFRNONAA >60 02/06/2021 0113     Discharge Instructions     Discharge patient   Complete by: As directed    Discharge disposition: 06-Home-Health Care Svc   Discharge patient date: 02/06/2021   Face-to-face encounter (required for Medicare/Medicaid patients)   Complete by: As directed    The encounter with the patient was in whole, or in part, for the following medical condition, which is the primary  reason for home health care: PAD   I certify that, based on my findings, the following services are medically necessary home health services: Physical therapy   Reason for Medically Necessary Home Health Services:  Therapy- Investment banker, operational, Patent examiner Therapy- Instruction on use of Assistive Device for Ambulation on all Surfaces Therapy- Home Adaptation to Facilitate Safety     My clinical findings support the need for the above services: Pain interferes with ambulation/mobility   Further, I certify that my clinical findings support that this patient is homebound due to: Unable to leave home safely without assistance   Home Health   Complete by: As directed    To provide the following care/treatments: PT       Discharge Diagnosis:  PAD (peripheral artery disease) (HCC) [I73.9]  Secondary Diagnosis: Patient Active Problem List   Diagnosis Date Noted   PAD (peripheral artery disease) (HCC) 02/04/2021   Past Medical History:  Diagnosis Date   History of DVT (deep vein thrombosis)    Post-operative   History of MRSA infection    Right hip   Peripheral vascular disease (HCC)      Allergies as of 02/06/2021       Reactions   Aspirin Rash        Medication List     TAKE these medications    acetaminophen 500 MG tablet Commonly known as: TYLENOL Take 500-1,000 mg by mouth every 6 (six) hours as needed for moderate pain.   atorvastatin 40 MG tablet Commonly known as: LIPITOR Take 1 tablet (40 mg total) by mouth daily at 6 PM.   clopidogrel 75 MG tablet Commonly known as: PLAVIX Take 1 tablet (75 mg total) by mouth daily at 6 (six) AM.        Discharge Instructions: [facility] [place] Discharge instructions Lower Extremity Bypass Surgery  Please refer to the following instruction for your post-procedure care. Your surgeon or physician assistant will discuss any changes with you.  Activity  You are encouraged  to walk as much as you can. You can slowly return to normal activities during the month after your surgery. Avoid strenuous activity and heavy lifting until your doctor tells you it's OK. Avoid activities such as vacuuming or swinging a golf club. Do not drive until your doctor give the OK and you are no longer taking prescription pain medications. It is also normal to have difficulty with sleep habits, eating and bowel movement after surgery. These will go away with time.  Bathing/Showering  You may shower after you go home. Do not soak in a bathtub, hot tub, or swim until the incision heals completely.  Incision Care  Clean your incision with mild soap and water. Shower every day. Pat the area dry with a clean towel. You do not need a bandage unless otherwise instructed. Do not apply any ointments or creams to your incision. If you have open wounds you will be instructed how to care for them or a visiting nurse may be arranged for you. If you  have staples or sutures along your incision they will be removed at your post-op appointment. You may have skin glue on your incision. Do not peel it off. It will come off on its own in about one week.  Wash the groin wound with soap and water daily and pat dry. (No tub bath-only shower)  Then put a dry gauze or washcloth in the groin to keep this area dry to help prevent wound infection.  Do this daily and as needed.  Do not use Vaseline or neosporin on your incisions.  Only use soap and water on your incisions and then protect and keep dry.  Diet  Resume your normal diet. There are no special food restrictions following this procedure. A low fat/ low cholesterol diet is recommended for all patients with vascular disease. In order to heal from your surgery, it is CRITICAL to get adequate nutrition. Your body requires vitamins, minerals, and protein. Vegetables are the best source of vitamins and minerals. Vegetables also provide the perfect balance of protein.  Processed food has little nutritional value, so try to avoid this.  Medications  Resume taking all your medications unless your doctor or Physician Assistant tells you not to. If your incision is causing pain, you may take over-the-counter pain relievers such as acetaminophen (Tylenol). If you were prescribed a stronger pain medication, please aware these medication can cause nausea and constipation. Prevent nausea by taking the medication with a snack or meal. Avoid constipation by drinking plenty of fluids and eating foods with high amount of fiber, such as fruits, vegetables, and grains. Take Colace 100 mg (an over-the-counter stool softener) twice a day as needed for constipation.  Do not take Tylenol if you are taking prescription pain medications.  Follow Up  Our office will schedule a follow up appointment 2-3 weeks following discharge.  Please call us immediately for any of the following conditions  Severe or worsening pain in your legs or feet while at rest or while walking Increase pain, redness, warmth, or drainage (pus) from your incision site(s) Fever of 101 degree or higher The swelling in your leg with the bypass suddenly worsens and becomes more painful than when you were in the hospital If you have been instructed to feel your graft pulse then you should do so every day. If you can no longer feel this pulse, call the office immediately. Not all patients are given this instruction.  Leg swelling is common after leg bypass surgery.  The swelling should improve over a few months following surgery. To improve the swelling, you may elevate your legs above the level of your heart while you are sitting or resting. Your surgeon or physician assistant may ask you to apply an ACE wrap or wear compression (TED) stockings to help to reduce swelling.  Reduce your risk of vascular disease  Stop smoking. If you would like help call [facility] at 1-800-QUIT-NOW ((508) 697-0178) or [facility] at 6503932257.  Manage your cholesterol Maintain a desired weight Control your diabetes weight Control your diabetes Keep your blood pressure down  If you have any questions, please call the office at [phone]   Prescriptions given: Plavix, statin Tylenol for pain  Disposition: Home  Patient's condition: is Excellent  Follow up: 1. Dr. Randie Heinz in 2 weeks   Wendi Maya, PA-C [facility] [phone] 02/07/2021  7:57 AM  - For VQI Registry use ---   Post-op:  Wound infection: No  Graft infection: No  Transfusion: No  If yes,  units given New Arrhythmia: Yes Ipsilateral amputation: No, [ ]  Minor, [ ]  BKA, [ ]  AKA Discharge patency: [x ] Primary, [ ]  Primary assisted, [ ]  Secondary, [ ]  Occluded Patency judged by: [x ] Dopper only, [ ]  Palpable graft pulse, []  Palpable distal pulse, [ ]  ABI inc. > 0.15, [ ]  Duplex Discharge ABI: R , L  D/C Ambulatory Status: Ambulatory with Assistance  Complications: MI: No, [ ]  Troponin only, [ ]  EKG or Clinical CHF: No Resp failure:No, [ ]  Pneumonia, [ ]  Ventilator Chg in renal function: No, [ ]  Inc. Cr > 0.5, [ ]  Temp. Dialysis,  [ ]  Permanent dialysis Stroke: No, [ ]  Minor, [ ]  Major Return to OR: No  Reason for return to OR: [ ]  Bleeding, [ ]  Infection, [ ]  Thrombosis, [ ]  Revision  Discharge medications: Statin use:  yes ASA use:  no Plavix use:  yes Beta blocker use: no CCB use:  No ACEI use:   no ARB use:  no Coumadin use: no

## 2021-03-09 ENCOUNTER — Other Ambulatory Visit: Payer: Self-pay

## 2021-03-09 ENCOUNTER — Ambulatory Visit (INDEPENDENT_AMBULATORY_CARE_PROVIDER_SITE_OTHER): Payer: Medicare Other | Admitting: Physician Assistant

## 2021-03-09 VITALS — BP 187/93 | HR 69 | Temp 97.2°F | Resp 18 | Ht 72.0 in | Wt 200.0 lb

## 2021-03-09 DIAGNOSIS — I70242 Atherosclerosis of native arteries of left leg with ulceration of calf: Secondary | ICD-10-CM

## 2021-03-09 NOTE — Progress Notes (Signed)
POST OPERATIVE OFFICE NOTE    CC:  F/u for surgery  HPI:  This is a 75 y.o. male who is s/p Left common femoral endarterectomy with vein patch angioplasty using left greater saphenous vein, stent placement of left common iliac artery with VBX stent and stent of left superficial femoral artery with Elluvia stent on 02/04/21 by Dr. Randie Heinz. 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  Patient presents with his granddaughter today. He states he has been doing well since his surgery. He says he has not had any pain. He has been ambulating without difficulty. He denies any pain on ambulation or rest. He has had continued swelling in the left leg and ankle but feels that it is improving some. He does not really elevate, only at night when he is in bed.  His incisions have healed. He has noted some fullness in area of left groin incision as well as saphenectomy site. He reports no bleeding or drainage. He is on statin but he is not taking Plavix or Aspirin due to intolerance. He said he took Plavix for a couple days but noticed itching all over and then his throat started to swell so his home health RN told him to stop taking it. He took a bunch of benadryl and his throat swelling resolved but has not resumed the Plavix. He says he had similar issue with Coumadin in the past.  Allergies  Allergen Reactions   Aspirin Rash    Current Outpatient Medications  Medication Sig Dispense Refill   acetaminophen (TYLENOL) 500 MG tablet Take 500-1,000 mg by mouth every 6 (six) hours as needed for moderate pain. (Patient not taking: Reported on 03/09/2021)     atorvastatin (LIPITOR) 40 MG tablet Take 1 tablet (40 mg total) by mouth daily at 6 PM. (Patient not taking: Reported on 03/09/2021) 30 tablet 3   clopidogrel (PLAVIX) 75 MG tablet Take 1 tablet (75 mg total) by mouth daily at 6 (six) AM. (Patient not taking: Reported on 03/09/2021) 30 tablet 3    No current facility-administered medications for this visit.     ROS:  See HPI  Physical Exam:  Vitals:   03/09/21 1115  BP: (!) 199/85  Pulse: 69  Resp: 18  Temp: (!) 97.2 F (36.2 C)  TempSrc: Oral  SpO2: 99%  Weight: 200 lb (90.7 kg)  Height: 6' (1.829 m)   General: well appearing, well nourished, in no distress Incision:  left groin incision and saphenectomy sites healed well. There is some fullness around both incisions. Suspect small seromas. No signs of infection. No erythema, no tenderness Extremities:  well perfused and warm. No palpable distal pulses bilaterally. Left leg and foot edematous. Dry eschars present on lateral left leg Neuro: alert and oriented Abdomen:  flat, soft, non tender  Assessment/Plan:  This is a 75 y.o. male who is s/p Left common femoral endarterectomy with vein patch angioplasty using left greater saphenous vein, stent placement of left common iliac artery with VBX stent and stent of left superficial femoral artery with Elluvia stent on 02/04/21 by Dr. Randie Heinz. 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 His incisions have healed nicely. Advised him that the areas of fullness will continue to resolve with time. I have encouraged him to continue ambulating as tolerated.  - Recommend daily elevation of his legs to help with swelling. Also  feel he will benefit from light compression stocking. He has a compression stocking at home so he will start wearing that - Had an allergic reaction to Plavix and has known allergy to Aspirin as well so he is not willing to continue either - He will continue his statin - I will have him follow up in 1 month with ABIs, iliac duplex and LLE arterial duplex   Graceann Congress, PA-C [facility] [phone]  Clinic MD:  Cain/ Edilia Bo

## 2021-03-11 ENCOUNTER — Other Ambulatory Visit: Payer: Self-pay

## 2021-03-11 DIAGNOSIS — I70242 Atherosclerosis of native arteries of left leg with ulceration of calf: Secondary | ICD-10-CM

## 2021-04-20 ENCOUNTER — Ambulatory Visit: Payer: Medicare Other

## 2021-04-20 ENCOUNTER — Encounter (HOSPITAL_COMMUNITY): Payer: Medicare Other

## 2022-09-30 DIAGNOSIS — I739 Peripheral vascular disease, unspecified: Secondary | ICD-10-CM | POA: Insufficient documentation

## 2022-09-30 DIAGNOSIS — L602 Onychogryphosis: Secondary | ICD-10-CM | POA: Insufficient documentation

## 2022-10-11 ENCOUNTER — Ambulatory Visit (INDEPENDENT_AMBULATORY_CARE_PROVIDER_SITE_OTHER): Payer: Medicare Other | Admitting: Podiatry

## 2022-10-11 ENCOUNTER — Ambulatory Visit (INDEPENDENT_AMBULATORY_CARE_PROVIDER_SITE_OTHER): Payer: Medicare Other

## 2022-10-11 DIAGNOSIS — M19072 Primary osteoarthritis, left ankle and foot: Secondary | ICD-10-CM | POA: Diagnosis not present

## 2022-10-11 DIAGNOSIS — M2170 Unequal limb length (acquired), unspecified site: Secondary | ICD-10-CM

## 2022-10-11 DIAGNOSIS — M792 Neuralgia and neuritis, unspecified: Secondary | ICD-10-CM

## 2022-10-11 DIAGNOSIS — M778 Other enthesopathies, not elsewhere classified: Secondary | ICD-10-CM

## 2022-10-11 DIAGNOSIS — I739 Peripheral vascular disease, unspecified: Secondary | ICD-10-CM

## 2022-10-11 MED ORDER — GABAPENTIN 300 MG PO CAPS: 300.0000 mg | ORAL_CAPSULE | Freq: Three times a day (TID) | ORAL | 3 refills | Status: AC

## 2022-10-11 MED ORDER — AMMONIUM LACTATE 12 % EX CREA: 1.0000 | TOPICAL_CREAM | CUTANEOUS | 0 refills | Status: AC | PRN

## 2022-10-11 NOTE — Progress Notes (Signed)
Chief Complaint  Patient presents with   Peripheral Neuropathy    Patient is experiencing tingling and burning to bilateral feet. Patient is currently taking gabapentin 100mg  three times a day with no relief. No known injuries.     HPI: 77 y.o. male presenting today stating that he hopes someone can find a reason behind his foot and calf pain on the left making it difficult to walk.  This patient does have confirmed peripheral arterial disease in the bilateral lower extremity.  He has stents in the left lower extremity.  He has had a prior hip replacement on the right.  He does have a shortened right leg due to the surgery.  He wears a lift on the outer sole of his shoe.  He states that he has tingling and burning in the feet but mostly of the left side.  He is currently taking gabapentin 100 mg 3 times a day and states this is not relieving any of the symptoms.  It is to recall to sleep and is worse at night.  He does not recall any lower back injuries or issues.  The pain is worse when standing or walking.  He is comfortable when sitting in a chair.  He also has issues with swelling of the legs and dry scaly skin on the legs and feet.  He is wondering what he can do for this.  Past Medical History:  Diagnosis Date   History of DVT (deep vein thrombosis)    Post-operative   History of MRSA infection    Right hip   Peripheral vascular disease (HCC)     Past Surgical History:  Procedure Laterality Date   ENDARTERECTOMY FEMORAL Left 02/04/2021   Procedure: LEFT COMMON FEMORAL ENDARTERECTOMY;  Surgeon: Maeola Harman, MD;  Location: [facility] OR;  Service: Vascular;  Laterality: Left;   HIP SURGERY Right    INSERTION OF ILIAC STENT Left 02/04/2021   Procedure: INSERTION OF RETROGRADE LEFT ILIAC STENT;  Surgeon: Maeola Harman, MD;  Location: [facility] OR;  Service: Vascular;  Laterality: Left;   INTRAVASCULAR STENT INCLUDING PTA AND IMAGING Left 02/04/2021   Procedure: LEFT  SUPERFICIAL FEMORAL ARTERY STENT;  Surgeon: Maeola Harman, MD;  Location: [facility] OR;  Service: Vascular;  Laterality: Left;   LOWER EXTREMITY ANGIOGRAM Left 02/04/2021   Procedure: LEFT LOWER EXTREMITY ANGIOGRAM;  Surgeon: Maeola Harman, MD;  Location: [facility] OR;  Service: Vascular;  Laterality: Left;    Allergies  Allergen Reactions   Aspirin Rash   Plavix [Clopidogrel] Hives and Swelling    Throat swelling     Physical Exam:  General: The patient is alert and oriented x3 in no acute distress.  Dermatology: Skin is warm with dry scaly skin to the legs and feet.  There is hemosiderin deposition to the lower legs bilateral.  The skin is taut and slightly leathery in texture interspaces are clear of maceration and debris.    Vascular: Palpable pedal pulses bilaterally. Capillary refill within normal limits.  +1 pitting edema bilateral legs and ankles.  Neurological: Light touch sensation grossly intact bilateral feet.  Negative Tinel's sign with percussion of the posterior tibial nerve bilateral  Musculoskeletal Exam: Decreased range of motion of the ankle joint bilateral.  Also decreased range of motion of the first metatarsophalangeal joint bilateral.  No pain with compression of the calf bilateral.  Radiographic Exam (left foot and left ankle views 10/11/2022):  Normal osseous mineralization.  There is joint  space narrowing with degenerative joint changes at the tarsometatarsal joints of the left foot.  There is significant joint space narrowing with flattening of the metatarsal head at the first metatarsal phalangeal joint.  There is also joint space narrowing at the hallux IP joint.  There is mild generalized joint space narrowing at the ankle joint.  Assessment/Plan of Care: 1. Capsulitis of foot, left   2. Primary osteoarthritis of left foot   3. Neuralgia and neuritis   4. PVD (peripheral vascular disease) (HCC)     Meds ordered this encounter  Medications    gabapentin (NEURONTIN) 300 MG capsule    Sig: Take 1 capsule (300 mg total) by mouth 3 (three) times daily.    Dispense:  90 capsule    Refill:  3   ammonium lactate (AMLACTIN) 12 % cream    Sig: Apply 1 Application topically as needed for dry skin.    Dispense:  385 g    Refill:  0    Discussed clinical findings with patient today.  Since the patient already has coordination of care with vascular surgeon, we will rule out some type of radiculopathy versus generalized peripheral neuropathy.  Will get him set up for an EMG/nerve conduction study of the bilateral lower extremity.  Informed the patient that our office will help arrange this and get him set up with the appropriate facility.  In the meantime we will increase his gabapentin dosage to 300 mg 3 times daily.  Informed the patient that the most common side effect when increasing the dosage is drowsiness.  Discussed with his family member today to be on the look out for fall risk at home.  Informed him that this is still a fairly low dosage for neuropathy.  Prescription for AmLactin cream to apply to the legs and feet to help with the dry skin and scaliness.  He will use this once to twice daily.  Will contact the patient to review the EMG results and proceed from there.   Clerance Lav, DPM, FACFAS [facility]     [address] [place], [place] [address]                Office [phone]  Fax [phone]

## 2022-10-24 ENCOUNTER — Encounter: Payer: Self-pay | Admitting: Neurology

## 2022-11-14 ENCOUNTER — Encounter: Payer: Medicare Other | Admitting: Neurology

## 2022-11-14 ENCOUNTER — Other Ambulatory Visit: Payer: Self-pay

## 2022-11-14 ENCOUNTER — Encounter: Payer: Self-pay | Admitting: Neurology

## 2022-11-14 DIAGNOSIS — Z029 Encounter for administrative examinations, unspecified: Secondary | ICD-10-CM

## 2022-11-14 DIAGNOSIS — R202 Paresthesia of skin: Secondary | ICD-10-CM

## 2022-11-18 IMAGING — DX DG FEMUR 2+V PORT*R*
4 series · 4 of 4 positions shown · non-contrast
Comparison: None.

CLINICAL DATA: Exposed hardware

EXAM:
RIGHT FEMUR PORTABLE 2 VIEW

[femur ap proximal (1 of 2)]
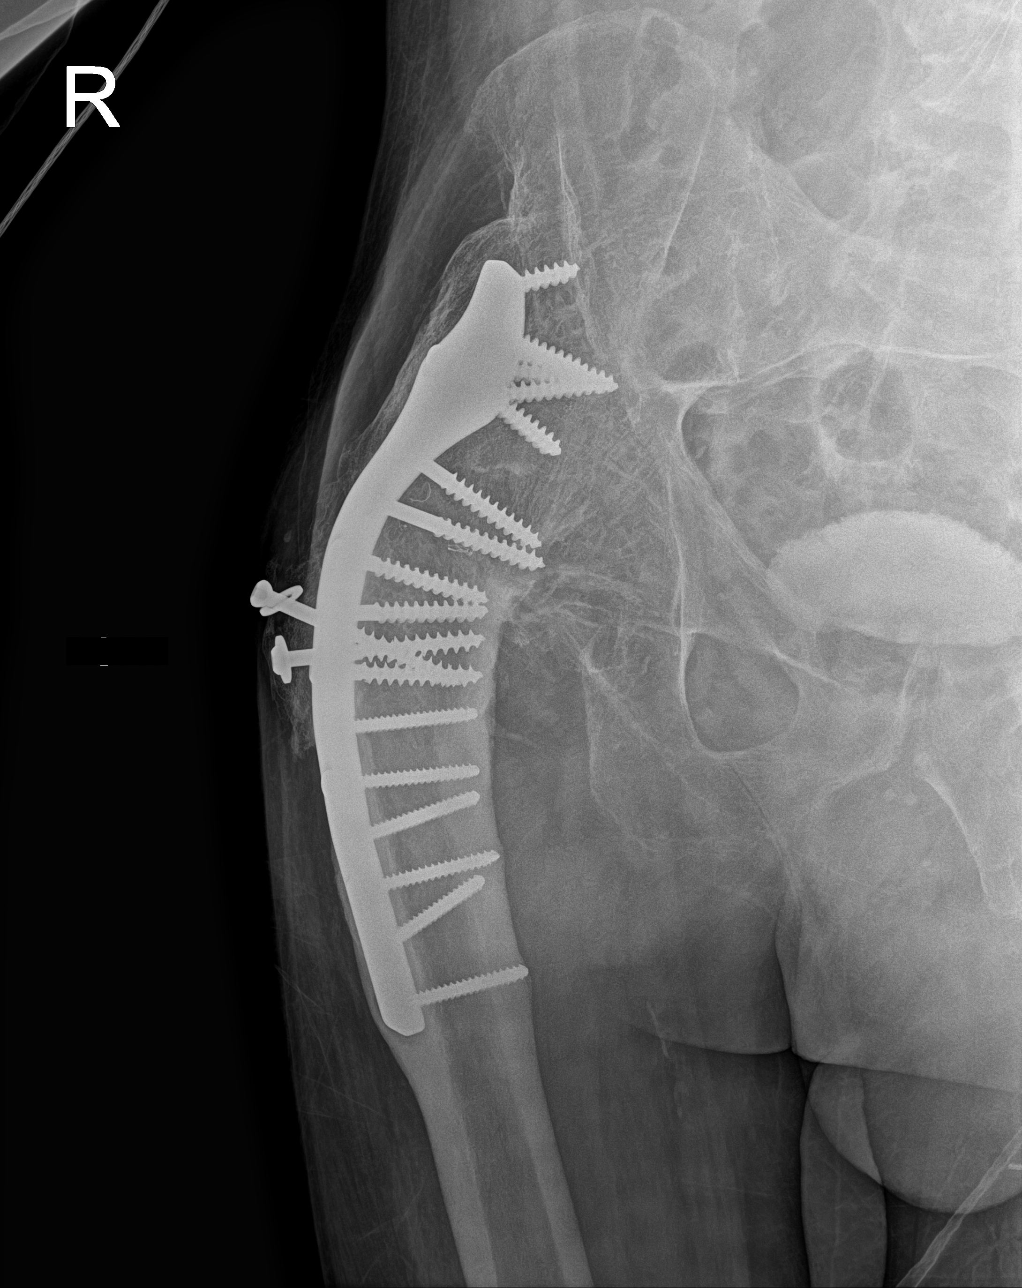

[femur ap proximal (2 of 2)]
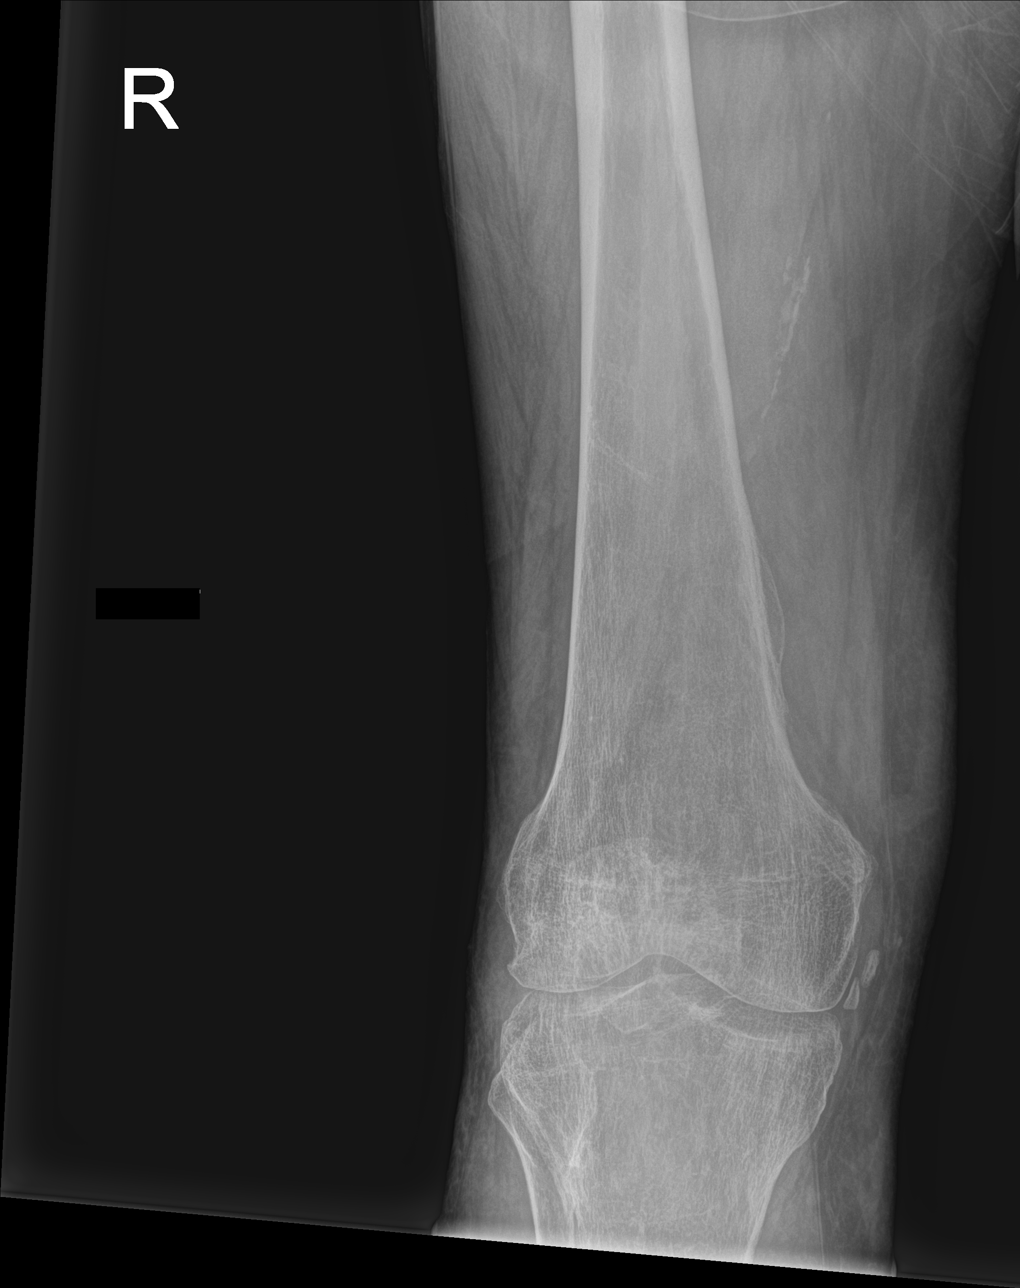

[femur lat (1 of 2)]
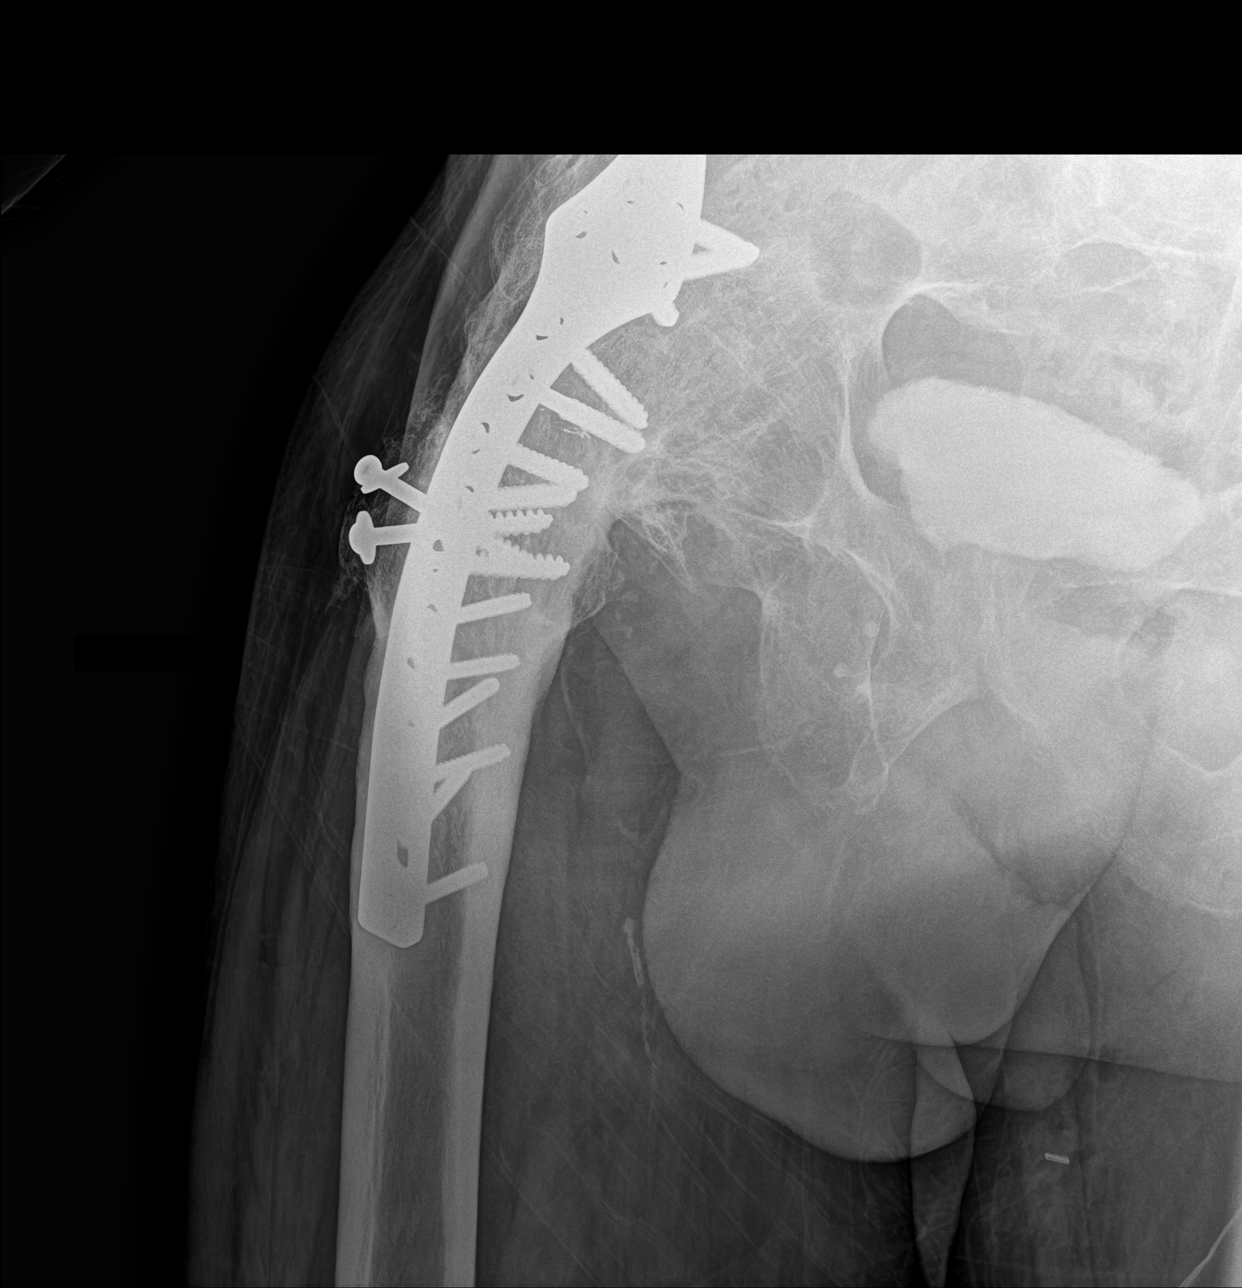

[femur lat (2 of 2)]
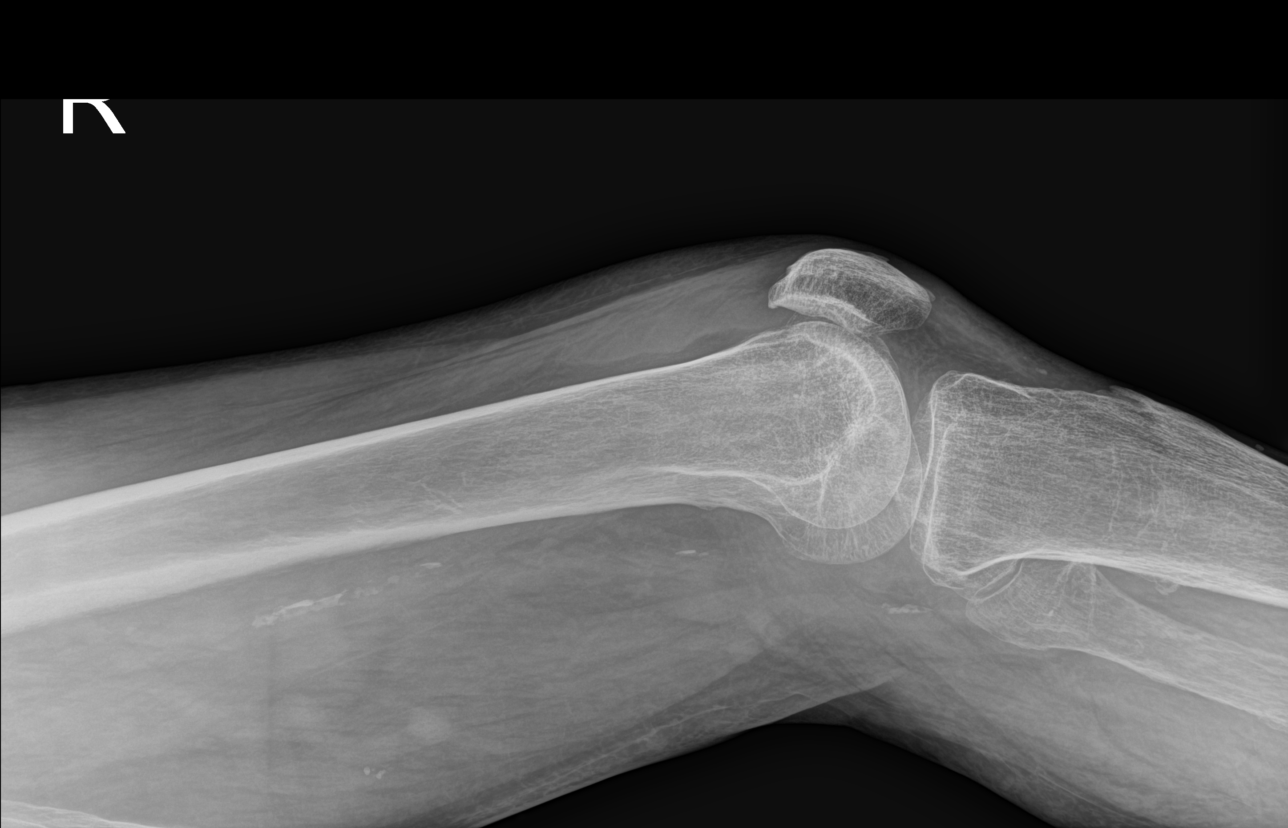

[4 of 4 positions shown; findings below may reference images not displayed]

FINDINGS: Surgical changes of prior ORIF of a remote proximal femoral
fracture. One of the cannulated lag screws has backed out and
protrudes through the skin surface. The proximal femur is diffusely
abnormal in appearance with complete ankylosis of the hip joint.
Additionally, the femoral neck appears high riding. Query prior
history of developmental dysplasia of the hip. Heterotopic
ossification is also present around the tube partially backed out
cannulated lag screws. Atherosclerotic calcifications visualized
throughout the leg. Probable small suprapatellar knee joint
effusion.
IMPRESSION: Surgical changes of prior ORIF of a remote proximal femoral fracture
with 2 cannulated lag screws which have backed [DATE] of which
protrudes through the skin surface.

Abnormal appearance of the proximal femur with complete ankylosis of
the hip joint. Query clinical history of developmental dysplasia of
the hip?

Small knee joint effusion.

## 2022-12-04 ENCOUNTER — Ambulatory Visit: Payer: Medicare Other | Admitting: Neurology

## 2022-12-15 ENCOUNTER — Ambulatory Visit: Payer: Medicare Other | Admitting: Neurology
